# Patient Record
Sex: Female | Born: 1995 | Race: Black or African American | Hispanic: No | Marital: Single | Smoking: Current some day smoker
Health system: Southern US, Community
[De-identification: ages and names within clinical notes are randomized; demographics above are authoritative.]

## PROBLEM LIST (undated history)

## (undated) ENCOUNTER — Inpatient Hospital Stay (HOSPITAL_COMMUNITY): Payer: Self-pay

## (undated) DIAGNOSIS — N390 Urinary tract infection, site not specified: Secondary | ICD-10-CM

## (undated) DIAGNOSIS — B999 Unspecified infectious disease: Secondary | ICD-10-CM

## (undated) DIAGNOSIS — R011 Cardiac murmur, unspecified: Secondary | ICD-10-CM

## (undated) DIAGNOSIS — O139 Gestational [pregnancy-induced] hypertension without significant proteinuria, unspecified trimester: Secondary | ICD-10-CM

## (undated) DIAGNOSIS — Z889 Allergy status to unspecified drugs, medicaments and biological substances status: Secondary | ICD-10-CM

## (undated) DIAGNOSIS — D649 Anemia, unspecified: Secondary | ICD-10-CM

## (undated) DIAGNOSIS — S065X9A Traumatic subdural hemorrhage with loss of consciousness of unspecified duration, initial encounter: Secondary | ICD-10-CM

## (undated) DIAGNOSIS — S066X9A Traumatic subarachnoid hemorrhage with loss of consciousness of unspecified duration, initial encounter: Secondary | ICD-10-CM

## (undated) DIAGNOSIS — A749 Chlamydial infection, unspecified: Secondary | ICD-10-CM

## (undated) HISTORY — DX: Traumatic subdural hemorrhage with loss of consciousness of unspecified duration, initial encounter: S06.5X9A

## (undated) HISTORY — PX: NO PAST SURGERIES: SHX2092

## (undated) HISTORY — DX: Traumatic subarachnoid hemorrhage with loss of consciousness of unspecified duration, initial encounter: S06.6X9A

## (undated) HISTORY — PX: INDUCED ABORTION: SHX677

---
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
## 2014-04-10 ENCOUNTER — Encounter (HOSPITAL_COMMUNITY): Payer: Self-pay | Admitting: *Deleted

## 2014-04-10 ENCOUNTER — Inpatient Hospital Stay (HOSPITAL_COMMUNITY)
Admission: AD | Admit: 2014-04-10 | Discharge: 2014-04-10 | Disposition: A | Discharge status: Home / Self Care | Payer: Medicaid Other | Source: Ambulatory Visit | Attending: Obstetrics & Gynecology | Admitting: Obstetrics & Gynecology

## 2014-04-10 DIAGNOSIS — A749 Chlamydial infection, unspecified: Secondary | ICD-10-CM | POA: Diagnosis not present

## 2014-04-10 DIAGNOSIS — N760 Acute vaginitis: Secondary | ICD-10-CM

## 2014-04-10 DIAGNOSIS — N898 Other specified noninflammatory disorders of vagina: Secondary | ICD-10-CM | POA: Diagnosis present

## 2014-04-10 DIAGNOSIS — F17210 Nicotine dependence, cigarettes, uncomplicated: Secondary | ICD-10-CM | POA: Insufficient documentation

## 2014-04-10 LAB — URINALYSIS, ROUTINE W REFLEX MICROSCOPIC
Bilirubin Urine: NEGATIVE
Glucose, UA: NEGATIVE mg/dL
Ketones, ur: NEGATIVE mg/dL
Leukocytes, UA: NEGATIVE
NITRITE: NEGATIVE
Protein, ur: NEGATIVE mg/dL
SPECIFIC GRAVITY, URINE: 1.025 (ref 1.005–1.030)
Urobilinogen, UA: 0.2 mg/dL (ref 0.0–1.0)
pH: 6.5 (ref 5.0–8.0)

## 2014-04-10 LAB — WET PREP, GENITAL
TRICH WET PREP: NONE SEEN
WBC, Wet Prep HPF POC: NONE SEEN
Yeast Wet Prep HPF POC: NONE SEEN

## 2014-04-10 LAB — URINE MICROSCOPIC-ADD ON

## 2014-04-10 LAB — POCT PREGNANCY, URINE: PREG TEST UR: NEGATIVE

## 2014-04-10 MED ORDER — FLUCONAZOLE 150 MG PO TABS: 150.0000 mg | ORAL_TABLET | Freq: Every day | ORAL | Status: DC

## 2014-04-10 MED ORDER — AZITHROMYCIN 250 MG PO TABS
1000.0000 mg | ORAL_TABLET | Freq: Once | ORAL | Status: AC
  Administered 2014-04-10: 1000 mg via ORAL
  Filled 2014-04-10: qty 4

## 2014-04-10 NOTE — MAU Note (Signed)
Upset; pt's boyfriend diagnosed with STD; desires to have STD check; does not want HIV test; c/o vaginal discharge but denies any pain; nervous about having speculum exam; also c/o rash on upper abdomen that intermittently feels dry and itchy;

## 2014-04-10 NOTE — MAU Provider Note (Signed)
History     CSN: [id number]  Arrival date and time: 04/10/14 [id number]   First Provider Initiated Contact with Patient 04/10/14 1037      Chief Complaint  Patient presents with  . Vaginal Discharge   HPI    Ms. Evelyn Mcclain is 19 y.o. female G0P0 who presents for STD testing. Her partner was diagnosed recently with an STD however he is not telling her what the STD is.   Her partner told her that he was prescribed azithromycin after going to the STD clinic; he would not tell her what the medication was prescribed for.     OB History    Gravida Para Term Preterm AB TAB SAB Ectopic Multiple Living   0               Past Medical History  Diagnosis Date  . Heart murmur   . Asthma   . Hx of seasonal allergies     Past Surgical History  Procedure Laterality Date  . No past surgeries      Family History  Problem Relation Age of Onset  . Hypertension Mother   . Hypertension Maternal Aunt   . Hypertension Maternal Uncle   . Hypertension Maternal Grandmother   . Hypertension Maternal Grandfather   . Diabetes Paternal Grandmother   . Diabetes Paternal Grandfather     History  Substance Use Topics  . Smoking status: Current Every Day Smoker -- 0.25 packs/day  . Smokeless tobacco: Never Used  . Alcohol Use: No    Allergies:  Allergies  Allergen Reactions  . Septra [Bactrim] Rash    No prescriptions prior to admission   Results for orders placed or performed during the hospital encounter of 04/10/14 (from the past 48 hour(s))  Urinalysis, Routine w reflex microscopic     Status: Abnormal   Collection Time: 04/10/14  9:50 AM  Result Value Ref Range   Color, Urine YELLOW YELLOW   APPearance CLEAR CLEAR   Specific Gravity, Urine 1.025 1.005 - 1.030   pH 6.5 5.0 - 8.0   Glucose, UA NEGATIVE NEGATIVE mg/dL   Hgb urine dipstick TRACE (A) NEGATIVE   Bilirubin Urine NEGATIVE NEGATIVE   Ketones, ur NEGATIVE NEGATIVE mg/dL   Protein, ur NEGATIVE NEGATIVE mg/dL    Urobilinogen, UA 0.2 0.0 - 1.0 mg/dL   Nitrite NEGATIVE NEGATIVE   Leukocytes, UA NEGATIVE NEGATIVE  Urine microscopic-add on     Status: Abnormal   Collection Time: 04/10/14  9:50 AM  Result Value Ref Range   Squamous Epithelial / LPF FEW (A) RARE   RBC / HPF 0-2 <3 RBC/hpf   Bacteria, UA RARE RARE   Urine-Other MUCOUS PRESENT   Pregnancy, urine POC     Status: None   Collection Time: 04/10/14 10:07 AM  Result Value Ref Range   Preg Test, Ur NEGATIVE NEGATIVE    Comment:        THE SENSITIVITY OF THIS METHODOLOGY IS >24 mIU/mL   Wet prep, genital     Status: Abnormal   Collection Time: 04/10/14 11:00 AM  Result Value Ref Range   Yeast Wet Prep HPF POC NONE SEEN NONE SEEN   Trich, Wet Prep NONE SEEN NONE SEEN   Clue Cells Wet Prep HPF POC FEW (A) NONE SEEN   WBC, Wet Prep HPF POC NONE SEEN NONE SEEN    Comment: MANY BACTERIA SEEN    Review of Systems  Constitutional: Negative for fever and chills.  Gastrointestinal: Negative  for nausea, vomiting and abdominal pain.  Genitourinary: Negative for dysuria, urgency and frequency.       Odorous, white discharge; with itching    Physical Exam   Blood pressure 129/76, pulse 56, temperature 98.6 F (37 C), resp. rate 18, height  (1.702 m), weight 63.504 kg (140 lb), last menstrual period 04/03/2014.  Physical Exam  Constitutional: She is oriented to person, place, and time. She appears well-developed and well-nourished. No distress.  HENT:  Head: Normocephalic.  Eyes: Pupils are equal, round, and reactive to light.  Neck: Neck supple.  Respiratory: Effort normal.  GI: Soft.  Genitourinary:  GC and wet prep collected without a speculum   Musculoskeletal: Normal range of motion.  Neurological: She is oriented to person, place, and time.  Skin: Skin is warm. She is not diaphoretic.  Psychiatric: Her behavior is normal.    MAU Course  Procedures  None  MDM UA UPT  Wet prep GC- pending HIV- pending    Azithromycin 1 gram PO given in MAU  Assessment and Plan   A:  Abnormal vaginal discharge Chlamydia   P:  Discharge in stable condition RX: Diflucan  Condoms always Follow up with HD as needed No intercourse for 7 days once partner is treated.   Duane Lope, NP 04/10/2014 10:43 AM

## 2014-04-11 LAB — GC/CHLAMYDIA PROBE AMP (~~LOC~~) NOT AT ARMC
CHLAMYDIA, DNA PROBE: NEGATIVE
Neisseria Gonorrhea: NEGATIVE

## 2014-04-11 LAB — RPR: RPR: NONREACTIVE

## 2014-04-11 LAB — HIV ANTIBODY (ROUTINE TESTING W REFLEX): HIV SCREEN 4TH GENERATION: NONREACTIVE

## 2014-06-19 ENCOUNTER — Encounter (HOSPITAL_COMMUNITY): Payer: Self-pay | Admitting: Emergency Medicine

## 2014-06-19 ENCOUNTER — Emergency Department (HOSPITAL_COMMUNITY): Payer: Medicaid Other

## 2014-06-19 ENCOUNTER — Emergency Department (HOSPITAL_COMMUNITY)
Admission: EM | Admit: 2014-06-19 | Discharge: 2014-06-19 | Disposition: A | Discharge status: Home / Self Care | Payer: Medicaid Other | Attending: Emergency Medicine | Admitting: Emergency Medicine

## 2014-06-19 DIAGNOSIS — Z79899 Other long term (current) drug therapy: Secondary | ICD-10-CM | POA: Insufficient documentation

## 2014-06-19 DIAGNOSIS — J45909 Unspecified asthma, uncomplicated: Secondary | ICD-10-CM | POA: Diagnosis not present

## 2014-06-19 DIAGNOSIS — Y92410 Unspecified street and highway as the place of occurrence of the external cause: Secondary | ICD-10-CM | POA: Diagnosis not present

## 2014-06-19 DIAGNOSIS — M25511 Pain in right shoulder: Secondary | ICD-10-CM

## 2014-06-19 DIAGNOSIS — M549 Dorsalgia, unspecified: Secondary | ICD-10-CM

## 2014-06-19 DIAGNOSIS — R011 Cardiac murmur, unspecified: Secondary | ICD-10-CM | POA: Diagnosis not present

## 2014-06-19 DIAGNOSIS — Y998 Other external cause status: Secondary | ICD-10-CM | POA: Diagnosis not present

## 2014-06-19 DIAGNOSIS — Y9389 Activity, other specified: Secondary | ICD-10-CM | POA: Diagnosis not present

## 2014-06-19 DIAGNOSIS — S299XXA Unspecified injury of thorax, initial encounter: Secondary | ICD-10-CM | POA: Diagnosis not present

## 2014-06-19 DIAGNOSIS — S4991XA Unspecified injury of right shoulder and upper arm, initial encounter: Secondary | ICD-10-CM | POA: Diagnosis present

## 2014-06-19 DIAGNOSIS — Z720 Tobacco use: Secondary | ICD-10-CM | POA: Insufficient documentation

## 2014-06-19 MED ORDER — NAPROXEN 500 MG PO TABS: 500.0000 mg | ORAL_TABLET | Freq: Two times a day (BID) | ORAL | Status: DC

## 2014-06-19 MED ORDER — METHOCARBAMOL 500 MG PO TABS: 500.0000 mg | ORAL_TABLET | Freq: Two times a day (BID) | ORAL | Status: DC
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
## 2014-09-26 ENCOUNTER — Inpatient Hospital Stay (HOSPITAL_COMMUNITY)
Admission: AD | Admit: 2014-09-26 | Discharge: 2014-09-26 | Disposition: A | Discharge status: Home / Self Care | Payer: Self-pay | Source: Ambulatory Visit | Attending: Obstetrics & Gynecology | Admitting: Obstetrics & Gynecology

## 2014-09-26 ENCOUNTER — Encounter (HOSPITAL_COMMUNITY): Payer: Self-pay | Admitting: *Deleted

## 2014-09-26 DIAGNOSIS — N898 Other specified noninflammatory disorders of vagina: Secondary | ICD-10-CM | POA: Insufficient documentation

## 2014-09-26 DIAGNOSIS — F17210 Nicotine dependence, cigarettes, uncomplicated: Secondary | ICD-10-CM | POA: Insufficient documentation

## 2014-09-26 DIAGNOSIS — A749 Chlamydial infection, unspecified: Secondary | ICD-10-CM | POA: Insufficient documentation

## 2014-09-26 DIAGNOSIS — Z202 Contact with and (suspected) exposure to infections with a predominantly sexual mode of transmission: Secondary | ICD-10-CM

## 2014-09-26 HISTORY — DX: Chlamydial infection, unspecified: A74.9

## 2014-09-26 LAB — POCT PREGNANCY, URINE: PREG TEST UR: NEGATIVE

## 2014-09-26 LAB — URINALYSIS, ROUTINE W REFLEX MICROSCOPIC
Bilirubin Urine: NEGATIVE
GLUCOSE, UA: NEGATIVE mg/dL
KETONES UR: NEGATIVE mg/dL
LEUKOCYTES UA: NEGATIVE
Nitrite: NEGATIVE
PH: 8 (ref 5.0–8.0)
Protein, ur: NEGATIVE mg/dL
Specific Gravity, Urine: 1.02 (ref 1.005–1.030)
Urobilinogen, UA: 1 mg/dL (ref 0.0–1.0)

## 2014-09-26 LAB — URINE MICROSCOPIC-ADD ON

## 2014-09-26 LAB — WET PREP, GENITAL
Trich, Wet Prep: NONE SEEN
Yeast Wet Prep HPF POC: NONE SEEN

## 2014-09-26 MED ORDER — CEFTRIAXONE SODIUM 250 MG IJ SOLR
250.0000 mg | Freq: Once | INTRAMUSCULAR | Status: AC
  Administered 2014-09-26: 250 mg via INTRAMUSCULAR
  Filled 2014-09-26: qty 250

## 2014-09-26 MED ORDER — FLUCONAZOLE 150 MG PO TABS
150.0000 mg | ORAL_TABLET | Freq: Once | ORAL | Status: AC
  Administered 2014-09-26: 150 mg via ORAL
  Filled 2014-09-26: qty 1

## 2014-09-26 MED ORDER — AZITHROMYCIN 250 MG PO TABS
1000.0000 mg | ORAL_TABLET | Freq: Once | ORAL | Status: AC
  Administered 2014-09-26: 1000 mg via ORAL
  Filled 2014-09-26: qty 4

## 2014-09-26 MED ORDER — FLUCONAZOLE 150 MG PO TABS: 150.0000 mg | ORAL_TABLET | ORAL | Status: DC | PRN

## 2014-09-26 NOTE — MAU Note (Signed)
Pt reports she had chlamydia in May. Still having discharge and itching. Thinks she did not wait ling enugh before she had intercourse after she was treated.

## 2014-09-26 NOTE — Discharge Instructions (Signed)

## 2014-09-26 NOTE — MAU Provider Note (Signed)
Chief Complaint: Vaginal Discharge  First Provider Initiated Contact with Patient 09/26/14 1247.      SUBJECTIVE HPI: Evelyn Mcclain is a 19 y.o. G0P0 female who presents to Maternity Admissions reporting vaginal discharge, concern for chlamydia because she was diagnosed with Chlamydia in May, treated with antibiotics, but resumed intercourse less than a week afterward. She is also requesting a pregnancy test. She states she had a positive pregnancy test at a different hospital 2 months ago and had one day of vaginal bleeding 09/09/2014. Did not have any ultrasounds or lab work done in addition to the urine pregnancy test.   No associated fever, chills, abdominal pain or passage of tissue or clots. Patient states she has been having unprotected intercourse. She is not taken anything for the vaginal discharge.  Past Medical History  Diagnosis Date  . Heart murmur   . Hx of seasonal allergies   . Chlamydia    OB History  Gravida Para Term Preterm AB SAB TAB Ectopic Multiple Living  0                Past Surgical History  Procedure Laterality Date  . No past surgeries     Social History   Social History  . Marital Status: Single    Spouse Name: N/A  . Number of Children: N/A  . Years of Education: N/A   Occupational History  . Not on file.   Social History Main Topics  . Smoking status: Current Every Day Smoker -- 0.25 packs/day    Types: Cigarettes  . Smokeless tobacco: Never Used  . Alcohol Use: No  . Drug Use: No     Comment: 1 month ago  . Sexual Activity: Yes    Birth Control/ Protection: None   Other Topics Concern  . Not on file   Social History Narrative   No current facility-administered medications on file prior to encounter.   Current Outpatient Prescriptions on File Prior to Encounter  Medication Sig Dispense Refill  . fluconazole (DIFLUCAN) 150 MG tablet Take 1 tablet (150 mg total) by mouth daily. 2 tablet 0  . methocarbamol (ROBAXIN) 500 MG tablet  Take 1 tablet (500 mg total) by mouth 2 (two) times daily. 15 tablet 0  . naproxen (NAPROSYN) 500 MG tablet Take 1 tablet (500 mg total) by mouth 2 (two) times daily. 30 tablet 0   Allergies  Allergen Reactions  . Septra [Bactrim] Rash    I have reviewed the past Medical Hx, Surgical Hx, Social Hx, Allergies and Medications.   Review of Systems  Constitutional: Negative for fever and chills.  Gastrointestinal: Negative for abdominal pain.  Genitourinary: Positive for vaginal discharge and menstrual problem (LMP was shorter than usual). Negative for dysuria, frequency, vaginal bleeding, vaginal pain and pelvic pain.  Musculoskeletal: Negative for back pain.    OBJECTIVE Patient Vitals for the past 24 hrs:  BP Temp Pulse Resp Height Weight  09/26/14 1213 121/76 mmHg 98.5 F (36.9 C) (!) 56 18  (1.702 m) 144 lb 6.4 oz (65.499 kg)   Constitutional: Well-developed, well-nourished female in no acute distress.  Cardiovascular: Mild bradycardia. Normal rhythm. Respiratory: normal rate and effort.  GI: Abd soft, non-tender. MS: Extremities nontender, no edema, normal ROM Neurologic: Alert and oriented x 4.  GU: Neg CVAT.  SPECULUM EXAM: NEFG except for folliculitis on mons pubis, moderate amount of thick, curd-like, white, odorless discharge consistent with yeast infection. No mucopurulent discharge. no blood noted, cervix clean, but incompletely  visualized due to patient intolerance of exam.  BIMANUAL: Refused due to intolerance of exam.  LAB RESULTS Pregnancy, urine POC     Status: None   Collection Time: 09/26/14 12:50 PM  Result Value Ref Range   Preg Test, Ur NEGATIVE NEGATIVE    IMAGING No results found.  MAU COURSE UPT, GC/chlamydia cultures, wet prep. UPT initially reported positive due to collection error. Recollected, negative.  Patient requesting retreatment and expedited partner therapy. Given Zithromax and and Rocephin due to unprotected intercourse. Diflucan  given for clinical diagnosis of vaginal yeast infection.  MDM 19 year old female with STD re-exposure before adequate treatment for chlamydia now retreated in maternity admissions and given expedited partner therapy. No evidence of PID or any other emergent condition today. Partner present and denies medication allergies. Patient with reported positive UPT 4 months ago, one episode of vaginal bleeding since then and negative UPT today. No documentation of UPT in May. Possible early miscarriage.   ASSESSMENT 1. Vaginal discharge   2. Chlamydia contact, untreated    PLAN Discharge home in stable condition. No intercourse for at least one week after patient and partner treated. Encourage condom use. Will follow up with health department for birth control. Risks of STDs reviewed in detail with patient and partner.  Follow-up Information    Follow up with Gynecologist.   Why:  Routine gynecology care       Medication List    TAKE these medications        fluconazole 150 MG tablet  Commonly known as:  DIFLUCAN  Take 1 tablet (150 mg total) by mouth every three (3) days as needed.     methocarbamol 500 MG tablet  Commonly known as:  ROBAXIN  Take 1 tablet (500 mg total) by mouth 2 (two) times daily.     naproxen 500 MG tablet  Commonly known as:  NAPROSYN  Take 1 tablet (500 mg total) by mouth 2 (two) times daily.       [place], CNM 09/26/2014  12:56 PM

## 2014-09-27 LAB — GC/CHLAMYDIA PROBE AMP (~~LOC~~) NOT AT ARMC
CHLAMYDIA, DNA PROBE: NEGATIVE
Neisseria Gonorrhea: NEGATIVE

## 2015-03-31 ENCOUNTER — Encounter (HOSPITAL_COMMUNITY): Payer: Self-pay | Admitting: *Deleted

## 2015-03-31 ENCOUNTER — Inpatient Hospital Stay (HOSPITAL_COMMUNITY)
Admission: AD | Admit: 2015-03-31 | Discharge: 2015-03-31 | Disposition: A | Discharge status: Home / Self Care | Payer: No Typology Code available for payment source | Source: Ambulatory Visit | Attending: Family Medicine | Admitting: Family Medicine

## 2015-03-31 DIAGNOSIS — A499 Bacterial infection, unspecified: Secondary | ICD-10-CM

## 2015-03-31 DIAGNOSIS — Z113 Encounter for screening for infections with a predominantly sexual mode of transmission: Secondary | ICD-10-CM

## 2015-03-31 DIAGNOSIS — N760 Acute vaginitis: Secondary | ICD-10-CM | POA: Insufficient documentation

## 2015-03-31 DIAGNOSIS — F17210 Nicotine dependence, cigarettes, uncomplicated: Secondary | ICD-10-CM | POA: Insufficient documentation

## 2015-03-31 DIAGNOSIS — N390 Urinary tract infection, site not specified: Secondary | ICD-10-CM | POA: Insufficient documentation

## 2015-03-31 DIAGNOSIS — B9689 Other specified bacterial agents as the cause of diseases classified elsewhere: Secondary | ICD-10-CM

## 2015-03-31 DIAGNOSIS — Z202 Contact with and (suspected) exposure to infections with a predominantly sexual mode of transmission: Secondary | ICD-10-CM

## 2015-03-31 LAB — CBC
HCT: 32.7 % — ABNORMAL LOW (ref 36.0–46.0)
Hemoglobin: 10.6 g/dL — ABNORMAL LOW (ref 12.0–15.0)
MCH: 30.1 pg (ref 26.0–34.0)
MCHC: 32.4 g/dL (ref 30.0–36.0)
MCV: 92.9 fL (ref 78.0–100.0)
PLATELETS: 271 10*3/uL (ref 150–400)
RBC: 3.52 MIL/uL — ABNORMAL LOW (ref 3.87–5.11)
RDW: 12.2 % (ref 11.5–15.5)
WBC: 7.9 10*3/uL (ref 4.0–10.5)

## 2015-03-31 LAB — URINALYSIS, ROUTINE W REFLEX MICROSCOPIC
BILIRUBIN URINE: NEGATIVE
GLUCOSE, UA: NEGATIVE mg/dL
KETONES UR: NEGATIVE mg/dL
Leukocytes, UA: NEGATIVE
Nitrite: NEGATIVE
PH: 7.5 (ref 5.0–8.0)
Protein, ur: NEGATIVE mg/dL
Specific Gravity, Urine: 1.02 (ref 1.005–1.030)

## 2015-03-31 LAB — WET PREP, GENITAL
Sperm: NONE SEEN
Trich, Wet Prep: NONE SEEN
Yeast Wet Prep HPF POC: NONE SEEN

## 2015-03-31 LAB — URINE MICROSCOPIC-ADD ON

## 2015-03-31 LAB — POCT PREGNANCY, URINE: Preg Test, Ur: NEGATIVE

## 2015-03-31 MED ORDER — ONDANSETRON 8 MG PO TBDP
8.0000 mg | ORAL_TABLET | Freq: Once | ORAL | Status: AC
  Administered 2015-03-31: 8 mg via ORAL
  Filled 2015-03-31: qty 1

## 2015-03-31 MED ORDER — AZITHROMYCIN 250 MG PO TABS
1000.0000 mg | ORAL_TABLET | Freq: Once | ORAL | Status: AC
  Administered 2015-03-31: 1000 mg via ORAL
  Filled 2015-03-31: qty 4

## 2015-03-31 MED ORDER — METRONIDAZOLE 500 MG PO TABS: 500.0000 mg | ORAL_TABLET | Freq: Two times a day (BID) | ORAL | Status: DC

## 2015-03-31 NOTE — Discharge Instructions (Signed)
Chlamydia, Female Chlamydia is an infection. It is spread through sexual contact. Chlamydia can be in different areas of the body. These areas include the cervix, urethra, throat, or rectum. You may not know you have chlamydia because many people never develop the symptoms. Chlamydia is not difficult to treat once you know you have it. However, if it is left untreated, chlamydia can lead to more serious health problems.  CAUSES  Chlamydia is caused by bacteria. It is a sexually transmitted disease. It is passed from an infected partner during intimate contact. This contact could be with the genitals, mouth, or rectal area. Chlamydia can also be passed from mothers to babies during birth. SIGNS AND SYMPTOMS  There may not be any symptoms. This is often the case early in the infection. If symptoms develop, they may include:  Mild pain and discomfort when urinating.  Redness, soreness, and swelling (inflammation) of the rectum.  Vaginal discharge.  Painful intercourse.  Abdominal pain.  Bleeding between menstrual periods. DIAGNOSIS  To diagnose this infection, your health care provider will do a pelvic exam. Cultures will be taken of the vagina, cervix, urine, and possibly the rectum to verify the diagnosis.  TREATMENT You will be given antibiotic medicines. If you are pregnant, certain types of antibiotics will need to be avoided. Any sexual partners should also be treated, even if they do not show symptoms. Your health care provider may test you for infection again 3 months after treatment. HOME CARE INSTRUCTIONS   Take your antibiotic medicine as directed by your health care provider. Finish the antibiotic even if you start to feel better.  Take medicines only as directed by your health care provider.  Inform any sexual partners about the infection. They should also be treated.  Do not have sexual contact until your health care provider tells you it is okay.  Get plenty of  rest.  Eat a well-balanced diet.  Drink enough fluids to keep your urine clear or pale yellow.  Keep all follow-up visits as directed by your health care provider. SEEK MEDICAL CARE IF:  You have painful urination.  You have abdominal pain.  You have vaginal discharge.  You have painful sexual intercourse.  You have bleeding between periods and after sex.  You have a fever. SEEK IMMEDIATE MEDICAL CARE IF:   You experience nausea or vomiting.  You experience excessive sweating (diaphoresis).  You have difficulty swallowing.   This information is not intended to replace advice given to you by your health care provider. Make sure you discuss any questions you have with your health care provider.   Document Released: 10/21/2004 Document Revised: 10/02/2014 Document Reviewed: 09/18/2012 Elsevier Interactive Patient Education 2016 Elsevier Inc[employer]  Bacterial Vaginosis Bacterial vaginosis is a vaginal infection that occurs when the normal balance of bacteria in the vagina is disrupted. It results from an overgrowth of certain bacteria. This is the most common vaginal infection in women of childbearing age. Treatment is important to prevent complications, especially in pregnant women, as it can cause a premature delivery. CAUSES  Bacterial vaginosis is caused by an increase in harmful bacteria that are normally present in smaller amounts in the vagina. Several different kinds of bacteria can cause bacterial vaginosis. However, the reason that the condition develops is not fully understood. RISK FACTORS Certain activities or behaviors can put you at an increased risk of developing bacterial vaginosis, including:  Having a new sex partner or multiple sex partners.  Douching.  Using an intrauterine device (  IUD) for contraception. Women do not get bacterial vaginosis from toilet seats, bedding, swimming pools, or contact with objects around them. SIGNS AND SYMPTOMS  Some women with  bacterial vaginosis have no signs or symptoms. Common symptoms include:  Grey vaginal discharge.  A fishlike odor with discharge, especially after sexual intercourse.  Itching or burning of the vagina and vulva.  Burning or pain with urination. DIAGNOSIS  Your health care provider will take a medical history and examine the vagina for signs of bacterial vaginosis. A sample of vaginal fluid may be taken. Your health care provider will look at this sample under a microscope to check for bacteria and abnormal cells. A vaginal pH test may also be done.  TREATMENT  Bacterial vaginosis may be treated with antibiotic medicines. These may be given in the form of a pill or a vaginal cream. A second round of antibiotics may be prescribed if the condition comes back after treatment. Because bacterial vaginosis increases your risk for sexually transmitted diseases, getting treated can help reduce your risk for chlamydia, gonorrhea, HIV, and herpes. HOME CARE INSTRUCTIONS   Only take over-the-counter or prescription medicines as directed by your health care provider.  If antibiotic medicine was prescribed, take it as directed. Make sure you finish it even if you start to feel better.  Tell all sexual partners that you have a vaginal infection. They should see their health care provider and be treated if they have problems, such as a mild rash or itching.  During treatment, it is important that you follow these instructions:  Avoid sexual activity or use condoms correctly.  Do not douche.  Avoid alcohol as directed by your health care provider.  Avoid breastfeeding as directed by your health care provider. SEEK MEDICAL CARE IF:   Your symptoms are not improving after 3 days of treatment.  You have increased discharge or pain.  You have a fever. MAKE SURE YOU:   Understand these instructions.  Will watch your condition.  Will get help right away if you are not doing well or get  worse. FOR MORE INFORMATION  Centers for Disease Control and Prevention, Division of STD Prevention: [employer]Apps[employer] American Sexual Health Association (ASHA): www.ashastd.org    This information is not intended to replace advice given to you by your health care provider. Make sure you discuss any questions you have with your health care provider.   Document Released: 01/11/2005 Document Revised: 02/01/2014 Document Reviewed: 08/23/2012 Elsevier Interactive Patient Education 2016 [employer]. Safe Sex Safe sex is about reducing the risk of giving or getting a sexually transmitted disease (STD). STDs are spread through sexual contact involving the genitals, mouth, or rectum. Some STDs can be cured and others cannot. Safe sex can also prevent unintended pregnancies.  WHAT ARE SOME SAFE SEX PRACTICES?  Limit your sexual activity to only one partner who is having sex with only you.  Talk to your partner about his or her past partners, past STDs, and drug use.  Use a condom every time you have sexual intercourse. This includes vaginal, oral, and anal sexual activity. Both females and males should wear condoms during oral sex. Only use latex or polyurethane condoms and water-based lubricants. Using petroleum-based lubricants or oils to lubricate a condom will weaken the condom and increase the chance that it will break. The condom should be in place from the beginning to the end of sexual activity. Wearing a condom reduces, but does not completely eliminate, your risk of getting or  giving an STD. STDs can be spread by contact with infected body fluids and skin.  Get vaccinated for hepatitis B and HPV.  Avoid alcohol and recreational drugs, which can affect your judgment. You may forget to use a condom or participate in high-risk sex.  For females, avoid douching after sexual intercourse. Douching can spread an infection farther into the reproductive tract.  Check your body for signs of sores,  blisters, rashes, or unusual discharge. See your health care provider if you notice any of these signs.  Avoid sexual contact if you have symptoms of an infection or are being treated for an STD. If you or your partner has herpes, avoid sexual contact when blisters are present. Use condoms at all other times.  If you are at risk of being infected with HIV, it is recommended that you take a prescription medicine daily to prevent HIV infection. This is called pre-exposure prophylaxis (PrEP). You are considered at risk if:  You are a man who has sex with other men (MSM).  You are a heterosexual man or woman who is sexually active with more than one partner.  You take drugs by injection.  You are sexually active with a partner who has HIV.  Talk with your health care provider about whether you are at high risk of being infected with HIV. If you choose to begin PrEP, you should first be tested for HIV. You should then be tested every 3 months for as long as you are taking PrEP.  See your health care provider for regular screenings, exams, and tests for other STDs. Before having sex with a new partner, each of you should be screened for STDs and should talk about the results with each other. WHAT ARE THE BENEFITS OF SAFE SEX?   There is less chance of getting or giving an STD.  You can prevent unwanted or unintended pregnancies.  By discussing safe sex concerns with your partner, you may increase feelings of intimacy, comfort, trust, and honesty between the two of you.   This information is not intended to replace advice given to you by your health care provider. Make sure you discuss any questions you have with your health care provider.   Document Released: 02/19/2004 Document Revised: 02/01/2014 Document Reviewed: 07/05/2011 Elsevier Interactive Patient Education 2016 [employer].            Expedited Partner Therapy:  Information Sheet for Patients and Partners                You have been offered expedited partner therapy (EPT). This information sheet contains important information and warnings you need to be aware of, so please read it carefully.   Expedited Partner Therapy (EPT) is the clinical practice of treating the sexual partners of persons who receive chlamydia, gonorrhea, or trichomoniasis diagnoses by providing medications or prescriptions to the patient. Patients then provide partners with these therapies without the health-care provider having examined the partner. In other words, EPT is a convenient, fast and private way for patients to help their sexual partners get treated.   Chlamydia and gonorrhea are bacterial infections you get from having sex with a person who is already infected. Trichomoniasis (or trich) is a very common sexually transmitted infection (STI) that is caused by infection with a protozoan parasite called Trichomonas vaginalis.  Many people with these infections dont know it because they feel fine, but without treatment these infections can cause serious health problems, such as pelvic inflammatory disease, ectopic  pregnancy, infertility and increased risk of HIV.   It is important to get treated as soon as possible to protect your health, to avoid spreading these infections to others, and to prevent yourself from becoming re-infected. The good news is these infections can be easily cured with proper antibiotic medicine. The best way to take care of your self is to see a doctor or go to your local health department. If you are not able to see a doctor or other medical provider, you should take EPT.    Recommended Medication: EPT for Chlamydia:  Azithromycin (Zithromax) 1 gram orally in a single dose EPT for Gonorrhea:  Cefixime (Suprax) 400 milligrams orally in a single dose PLUS azithromycin (Zithromax) 1 gram orally in a single dose EPT for Trichomoniasis:  Metronidazole (Flagyl) 2 grams orally in a single  dose   These medicines are very safe. However, you should not take them if you have ever had an allergic reaction (like a rash) to any of these medicines: azithromycin (Zithromax), erythromycin, clarithromycin (Biaxin), metronidazole (Flagyl), tinidazole (Tindimax). If you are uncertain about whether you have an allergy, call your medical provider or pharmacist before taking this medicine. If you have a serious, long-term illness like kidney, liver or heart disease, colitis or stomach problems, or you are currently taking other prescription medication, talk to your provider before taking this medication.   Women: If you have lower belly pain, pain during sex, vomiting, or a fever, do not take this medicine. Instead, you should see a medical provider to be certain you do not have pelvic inflammatory disease (PID). PID can be serious and lead to infertility, pregnancy problems or chronic pelvic pain.   Pregnant Women: It is very important for you to see a doctor to get pregnancy services and pre-natal care. These antibiotics for EPT are safe for pregnant women, but you still need to see a medical provider as soon as possible. It is also important to note that Doxycycline is an alternative therapy for chlamydia, but it should not be taken by someone who is pregnant.   Men: If you have pain or swelling in the testicles or a fever, do not take this medicine and see a medical provider.     Men who have sex with men (MSM): MSM in [place] continue to experience high rates of syphilis and HIV. Many MSM with gonorrhea or chlamydia could also have syphilis and/or HIV and not know it. If you are a man who has sex with other men, it is very important that you see a medical provider and are tested for HIV and syphilis. EPT is not recommended for gonorrhea for MSM.  Recommended treatment for gonorrhea for MSM is Rocephin (shot) AND azithromycin due to decreased cure rate.  Please see your medical provider if this  is the case.    Along with this information sheet is a prescription for the medicine. If you receive a prescription it will be in your name and will indicate your date of birth, or it will be in the name of Expedited Partner Therapy.   In either case, you can have the prescription filled at a pharmacy. You will be responsible for the cost of the medicine, unless you have prescription drug coverage. In that case, you could provide your name so the pharmacy could bill your health plan.   Take the medication as directed. Some people will have a mild, upset stomach, which does not last long. AVOID alcohol 24 hours after  taking metronidazole (Flagyl) to reduce the possibility of a disulfiram-like reaction (severe vomiting and abdominal pain).  After taking the medicine, do not have sex for 7 days. Do not share this medicine or give it to anyone else. It is important to tell everyone you have had sex with in the last 60 days that they need to go and get tested for sexually transmitted infections.   Ways to prevent these and other sexually transmitted infections (STIs):    Abstain from sex. This is the only sure way to avoid getting an STI.   Use barrier methods, such as condoms, consistently and correctly.   Limit the number of sexual partners.   Have regular physical exams, including testing for STIs.   For more information about EPT or other issues pertaining to an STI, please contact your medical provider or the [facility] Department at [phone] or [email or web address]

## 2015-03-31 NOTE — MAU Provider Note (Signed)
History     CSN: [id number]  Arrival date and time: 03/31/15 1252   First Provider Initiated Contact with Patient 03/31/15 1322         Chief Complaint  Patient presents with  . Urinary Tract Infection  . Vaginitis   HPI   Evelyn Mcclain is a 20 y.o. female who presents for vaginal discharge & STD screening.  States was treated for chlamydia 2 months ago but didn't wait the 7 days to have intercourse with her partner. Since then has a new partner and used condoms "most of the time". Has new vaginal discharge since last week. Reports thick white discharge with foul odor. Reports vaginal irritation, nausea, & increased urinary frequency.  Denies vaginal bleeding, vomiting, fever, abdominal pain, dyspareunia, or post coital bleeding.     OB History    Gravida Para Term Preterm AB TAB SAB Ectopic Multiple Living   1    1  1          Past Medical History  Diagnosis Date  . Heart murmur   . Hx of seasonal allergies   . Chlamydia     Past Surgical History  Procedure Laterality Date  . No past surgeries      Family History  Problem Relation Age of Onset  . Hypertension Mother   . Hypertension Maternal Aunt   . Hypertension Maternal Uncle   . Hypertension Maternal Grandmother   . Hypertension Maternal Grandfather   . Diabetes Paternal Grandmother   . Diabetes Paternal Grandfather     Social History  Substance Use Topics  . Smoking status: Current Every Day Smoker -- 0.25 packs/day    Types: Cigarettes  . Smokeless tobacco: Never Used  . Alcohol Use: No    Allergies:  Allergies  Allergen Reactions  . Septra [Bactrim] Rash    Prescriptions prior to admission  Medication Sig Dispense Refill Last Dose  . fluconazole (DIFLUCAN) 150 MG tablet Take 1 tablet (150 mg total) by mouth every three (3) days as needed. 2 tablet 0   . methocarbamol (ROBAXIN) 500 MG tablet Take 1 tablet (500 mg total) by mouth 2 (two) times daily. (Patient not taking: Reported on 09/26/2014) 15  tablet 0 Not Taking at Unknown time  . naproxen (NAPROSYN) 500 MG tablet Take 1 tablet (500 mg total) by mouth 2 (two) times daily. (Patient not taking: Reported on 09/26/2014) 30 tablet 0 Not Taking at Unknown time    Review of Systems  Constitutional: Negative.  Negative for fever and chills.  HENT: Negative.   Eyes: Negative.   Gastrointestinal: Positive for nausea. Negative for vomiting, abdominal pain, diarrhea and constipation.  Genitourinary: Positive for frequency. Negative for dysuria and urgency.       + vaginal discharge No postcoital bleeding or dyspareunia No vaginal bleeding  Skin: Negative.   Neurological: Negative for dizziness.   Physical Exam   Blood pressure 131/81, pulse 72, resp. rate 18, last menstrual period 03/22/2015.  Physical Exam  Nursing note and vitals reviewed. Constitutional: She is oriented to person, place, and time. She appears well-developed and well-nourished. No distress.  HENT:  Head: Normocephalic and atraumatic.  Eyes: Conjunctivae are normal. Right eye exhibits no discharge. Left eye exhibits no discharge. No scleral icterus.  Neck: Normal range of motion.  Cardiovascular: Normal rate.   Respiratory: Effort normal. No respiratory distress.  GI: Soft. She exhibits no distension. There is no tenderness.  Genitourinary: Cervix exhibits no motion tenderness. No bleeding in the vagina.  Vaginal discharge (moderate amount of yellow mucoid discharge) found.  Neurological: She is alert and oriented to person, place, and time.  Skin: Skin is warm and dry. She is not diaphoretic.  Psychiatric: She has a normal mood and affect. Her behavior is normal. Judgment and thought content normal.    MAU Course  Procedures Results for orders placed or performed during the hospital encounter of 03/31/15 (from the past 24 hour(s))  Urinalysis, Routine w reflex microscopic (not at [facility])     Status: Abnormal   Collection Time: 03/31/15  1:00 PM  Result Value Ref  Range   Color, Urine YELLOW YELLOW   APPearance HAZY (A) CLEAR   Specific Gravity, Urine 1.020 1.005 - 1.030   pH 7.5 5.0 - 8.0   Glucose, UA NEGATIVE NEGATIVE mg/dL   Hgb urine dipstick TRACE (A) NEGATIVE   Bilirubin Urine NEGATIVE NEGATIVE   Ketones, ur NEGATIVE NEGATIVE mg/dL   Protein, ur NEGATIVE NEGATIVE mg/dL   Nitrite NEGATIVE NEGATIVE   Leukocytes, UA NEGATIVE NEGATIVE  Urine microscopic-add on     Status: Abnormal   Collection Time: 03/31/15  1:00 PM  Result Value Ref Range   Squamous Epithelial / LPF 6-30 (A) NONE SEEN   WBC, UA 0-5 0 - 5 WBC/hpf   RBC / HPF 0-5 0 - 5 RBC/hpf   Bacteria, UA MANY (A) NONE SEEN   Urine-Other MUCOUS PRESENT   Pregnancy, urine POC     Status: None   Collection Time: 03/31/15  1:35 PM  Result Value Ref Range   Preg Test, Ur NEGATIVE NEGATIVE  CBC     Status: Abnormal   Collection Time: 03/31/15  1:58 PM  Result Value Ref Range   WBC 7.9 4.0 - 10.5 K/uL   RBC 3.52 (L) 3.87 - 5.11 MIL/uL   Hemoglobin 10.6 (L) 12.0 - 15.0 g/dL   HCT 16.1 (L) 09.6 - 04.5 %   MCV 92.9 78.0 - 100.0 fL   MCH 30.1 26.0 - 34.0 pg   MCHC 32.4 30.0 - 36.0 g/dL   RDW 40.9 81.1 - 91.4 %   Platelets 271 150 - 400 K/uL  Wet prep, genital     Status: Abnormal   Collection Time: 03/31/15  2:30 PM  Result Value Ref Range   Yeast Wet Prep HPF POC NONE SEEN NONE SEEN   Trich, Wet Prep NONE SEEN NONE SEEN   Clue Cells Wet Prep HPF POC FEW (A) NONE SEEN   WBC, Wet Prep HPF POC MODERATE (A) NONE SEEN   Sperm NONE SEEN     MDM UPT negative Azithromycin 1 gm PO for chlamydia re exposure STD testing pending  Assessment and Plan  A: 1. Exposure to chlamydia   2. Screen for STD (sexually transmitted disease)   3. BV (bacterial vaginosis)     P: Discharge home Rx flagyl (no alcohol) No intercourse x 7 days after both have been treated Expedited partner treatment rx & info sheet given GC/CT, HIV, RPR pending   Judeth Horn, NP  03/31/2015 4:07 PM

## 2015-03-31 NOTE — MAU Note (Signed)
Patient states she and her boyfriend were treated for Chlamydia a few weeks ago, "but I don't think it cleared it."  She presents now c/o foul smelling vaginal discharge "that looks like a yeast infection."

## 2015-04-01 LAB — HIV ANTIBODY (ROUTINE TESTING W REFLEX): HIV SCREEN 4TH GENERATION: NONREACTIVE

## 2015-04-01 LAB — RPR: RPR: NONREACTIVE

## 2015-04-01 LAB — GC/CHLAMYDIA PROBE AMP (~~LOC~~) NOT AT ARMC
CHLAMYDIA, DNA PROBE: NEGATIVE
NEISSERIA GONORRHEA: NEGATIVE

## 2015-08-20 ENCOUNTER — Encounter (HOSPITAL_COMMUNITY): Payer: Self-pay | Admitting: Family Medicine

## 2015-08-20 ENCOUNTER — Ambulatory Visit (HOSPITAL_COMMUNITY)
Admission: EM | Admit: 2015-08-20 | Discharge: 2015-08-20 | Disposition: A | Discharge status: Home / Self Care | Payer: Medicaid Other | Attending: Family Medicine | Admitting: Family Medicine

## 2015-08-20 DIAGNOSIS — Z79899 Other long term (current) drug therapy: Secondary | ICD-10-CM | POA: Insufficient documentation

## 2015-08-20 DIAGNOSIS — J020 Streptococcal pharyngitis: Secondary | ICD-10-CM | POA: Insufficient documentation

## 2015-08-20 DIAGNOSIS — F17200 Nicotine dependence, unspecified, uncomplicated: Secondary | ICD-10-CM | POA: Insufficient documentation

## 2015-08-20 LAB — POCT RAPID STREP A: STREPTOCOCCUS, GROUP A SCREEN (DIRECT): NEGATIVE

## 2015-08-20 MED ORDER — AMOXICILLIN 875 MG PO TABS: 875.0000 mg | ORAL_TABLET | Freq: Two times a day (BID) | ORAL | 0 refills | Status: DC

## 2015-08-20 MED ORDER — LIDOCAINE VISCOUS 2 % MT SOLN: 20.0000 mL | OROMUCOSAL | 0 refills | Status: DC | PRN

## 2015-08-20 NOTE — ED Provider Notes (Signed)
CSN: [id number]     Arrival date & time 08/20/15  1155 History   None    Chief Complaint  Patient presents with  . Sore Throat   (Consider location/radiation/quality/duration/timing/severity/associated sxs/prior Treatment)  Sore Throat  This is a new problem. The problem occurs constantly. The problem has not changed since onset.The symptoms are aggravated by swallowing. The symptoms are relieved by ice. She has tried nothing for the symptoms.    Past Medical History:  Diagnosis Date  . Chlamydia   . Heart murmur   . Hx of seasonal allergies    Past Surgical History:  Procedure Laterality Date  . NO PAST SURGERIES     Family History  Problem Relation Age of Onset  . Hypertension Mother   . Hypertension Maternal Aunt   . Hypertension Maternal Uncle   . Hypertension Maternal Grandmother   . Hypertension Maternal Grandfather   . Diabetes Paternal Grandmother   . Diabetes Paternal Grandfather    Social History  Substance Use Topics  . Smoking status: Current Every Day Smoker    Packs/day: 0.25    Types: Cigarettes  . Smokeless tobacco: Never Used  . Alcohol use No   OB History    Gravida Para Term Preterm AB Living   1       1     SAB TAB Ectopic Multiple Live Births   1             Review of Systems  Constitutional: Positive for fatigue and fever.  HENT: Positive for sore throat.   Eyes: Negative.   Respiratory: Negative.   Cardiovascular: Negative.   Gastrointestinal: Negative.   Endocrine: Negative.   Genitourinary: Negative.   Musculoskeletal: Negative.   Skin: Negative.   Allergic/Immunologic: Negative.   Neurological: Negative.   Hematological: Negative.   Psychiatric/Behavioral: Negative.     Allergies  Septra [bactrim]  Home Medications   Prior to Admission medications   Medication Sig Start Date End Date Taking? Authorizing Provider  amoxicillin (AMOXIL) 875 MG tablet Take 1 tablet (875 mg total) by mouth 2 (two) times daily. 08/20/15    Deatra Canter, FNP  amoxicillin (AMOXIL) 875 MG tablet Take 1 tablet (875 mg total) by mouth 2 (two) times daily. 08/20/15   Deatra Canter, FNP  lidocaine (XYLOCAINE) 2 % solution Use as directed 20 mLs in the mouth or throat every 4 (four) hours as needed for mouth pain. 08/20/15   Deatra Canter, FNP  metroNIDAZOLE (FLAGYL) 500 MG tablet Take 1 tablet (500 mg total) by mouth 2 (two) times daily. 03/31/15   Judeth Horn, NP   Meds Ordered and Administered this Visit  Medications - No data to display  BP 135/55   Pulse 99   Temp 99.2 F (37.3 C)   Resp 18   SpO2 99%  No data found.   Physical Exam  Constitutional: She appears well-developed and well-nourished.  HENT:  Head: Normocephalic and atraumatic.  Right Ear: External ear normal.  Left Ear: External ear normal.  OPX injected tonsils erythematous 2plus with exudates  Eyes: Conjunctivae and EOM are normal. Pupils are equal, round, and reactive to light.  Neck: Normal range of motion. Neck supple.  Cardiovascular: Normal rate and regular rhythm.   Pulmonary/Chest: Effort normal and breath sounds normal.  Abdominal: Soft. Bowel sounds are normal.    Urgent Care Course   Clinical Course    Procedures (including critical care time)  Labs Review Labs Reviewed  POCT  RAPID STREP A    Imaging Review No results found.   Visual Acuity Review  Right Eye Distance:   Left Eye Distance:   Bilateral Distance:    Right Eye Near:   Left Eye Near:    Bilateral Near:         MDM   1. Strep throat    Amoxicillin 875mg  one po bid x 7 days #14 Lidocaine viscous 2% 63ml po q4hours prn #173ml Tylenol and motrin otc as directed [employer] Gargles     Deatra Canter, FNP 08/20/15 1404    Deatra Canter, FNP 08/20/15 1406

## 2015-08-20 NOTE — ED Triage Notes (Signed)
Pt here for sore throat, fever, chills since this am.

## 2015-08-22 LAB — CULTURE, GROUP A STREP (THRC)

## 2015-08-24 ENCOUNTER — Telehealth (HOSPITAL_BASED_OUTPATIENT_CLINIC_OR_DEPARTMENT_OTHER): Payer: Self-pay | Admitting: *Deleted

## 2015-09-07 ENCOUNTER — Emergency Department (HOSPITAL_COMMUNITY)
Admission: EM | Admit: 2015-09-07 | Discharge: 2015-09-07 | Disposition: A | Discharge status: Home / Self Care | Payer: Medicaid Other | Attending: Emergency Medicine | Admitting: Emergency Medicine

## 2015-09-07 ENCOUNTER — Encounter (HOSPITAL_COMMUNITY): Payer: Self-pay | Admitting: Emergency Medicine

## 2015-09-07 DIAGNOSIS — F17210 Nicotine dependence, cigarettes, uncomplicated: Secondary | ICD-10-CM | POA: Insufficient documentation

## 2015-09-07 DIAGNOSIS — S0086XA Insect bite (nonvenomous) of other part of head, initial encounter: Secondary | ICD-10-CM | POA: Insufficient documentation

## 2015-09-07 DIAGNOSIS — Y999 Unspecified external cause status: Secondary | ICD-10-CM | POA: Insufficient documentation

## 2015-09-07 DIAGNOSIS — Y929 Unspecified place or not applicable: Secondary | ICD-10-CM | POA: Insufficient documentation

## 2015-09-07 DIAGNOSIS — Z79899 Other long term (current) drug therapy: Secondary | ICD-10-CM | POA: Insufficient documentation

## 2015-09-07 DIAGNOSIS — W57XXXA Bitten or stung by nonvenomous insect and other nonvenomous arthropods, initial encounter: Secondary | ICD-10-CM | POA: Insufficient documentation

## 2015-09-07 DIAGNOSIS — Y939 Activity, unspecified: Secondary | ICD-10-CM | POA: Insufficient documentation

## 2015-09-07 MED ORDER — DOXYCYCLINE HYCLATE 100 MG PO CAPS: 100.0000 mg | ORAL_CAPSULE | Freq: Two times a day (BID) | ORAL | 0 refills | Status: DC

## 2015-09-07 NOTE — ED Triage Notes (Addendum)
Yesterday felt bump under the skin on left face; feels irritation when she presses on it. No redness or raised area present.

## 2015-09-07 NOTE — ED Provider Notes (Signed)
[facility]-EMER[facility]ENCY DEPT Provider Note   CSN: [id number] Arrival date & time: 09/07/15  2057  First Provider Contact:  First MD Initiated Contact with Patient 09/07/15 2113     By signing my name below, I, Evelyn Mcclain, attest that this documentation has been prepared under the direction and in the presence of non-physician practitioner, [name], PA-C. Electronically Signed: Modena Mcclain, Scribe. 09/07/2015. 9:47 PM.  History   Chief Complaint Chief Complaint  Patient presents with  . Mass    The history is provided by the patient. No language interpreter was used.   HPI Comments: Evelyn Mcclain is a 20 y.o. female who presents to the Emergency Department complaining of a bump to the left side of her face that she noticed today. She states that she suspected an insect bite, and started noticing hardness and warmth to the site  later on. She states that she came to the ED today  a few times, but left before seeing a provider. She reports that she took tylenol and rested PTA without any relief.    She states that she is allergic to septra.   Past Medical History:  Diagnosis Date  . Chlamydia   . Heart murmur   . Hx of seasonal allergies     Patient Active Problem List   Diagnosis Date Noted  . Fall 08/30/2011  . 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 Subarachnoid hemorrhage following injury (HCC) 08/30/2011  . Polysubstance abuse 08/30/2011  . Acute blood loss anemia 08/30/2011    Past Surgical History:  Procedure Laterality Date  . NO PAST SURGERIES      OB History    Gravida Para Term Preterm AB Living   1       1     SAB TAB Ectopic Multiple Live Births   1               Home Medications    Prior to Admission medications   Medication Sig Start Date End Date Taking? Authorizing Provider  amoxicillin (AMOXIL) 875 MG tablet Take 1 tablet (875 mg total) by mouth 2 (two) times daily. 08/20/15   Deatra Canter, FNP  amoxicillin (AMOXIL) 875  MG tablet Take 1 tablet (875 mg total) by mouth 2 (two) times daily. 08/20/15   Deatra Canter, FNP  doxycycline (VIBRAMYCIN) 100 MG capsule Take 1 capsule (100 mg total) by mouth 2 (two) times daily. 09/07/15   [name], PA-C  lidocaine (XYLOCAINE) 2 % solution Use as directed 20 mLs in the mouth or throat every 4 (four) hours as needed for mouth pain. 08/20/15   Deatra Canter, FNP  metroNIDAZOLE (FLAGYL) 500 MG tablet Take 1 tablet (500 mg total) by mouth 2 (two) times daily. 03/31/15   Judeth Horn, NP    Family History Family History  Problem Relation Age of Onset  . Hypertension Mother   . Hypertension Maternal Aunt   . Hypertension Maternal Uncle   . Hypertension Maternal Grandmother   . Hypertension Maternal Grandfather   . Diabetes Paternal Grandmother   . Diabetes Paternal Grandfather     Social History Social History  Substance Use Topics  . Smoking status: Current Every Day Smoker    Packs/day: 0.25    Types: Cigarettes  . Smokeless tobacco: Never Used  . Alcohol use No     Allergies   Septra [bactrim]   Review of Systems Review of Systems  Constitutional: Negative for fever.  Skin: Positive for rash.  Physical Exam Updated Vital Signs BP 120/68 (BP Location: Right Arm)   Pulse 93   Temp 97.9 F (36.6 C) (Oral)   Resp 18   LMP  (Approximate) Comment: few weeks ago  SpO2 100%   Physical Exam  Constitutional: She is oriented to person, place, and time. No distress.  HENT:  Head: Normocephalic and atraumatic.  1x1 cm mildly erythematous bite on left temple, no abscess  Eyes: Conjunctivae and EOM are normal. Pupils are equal, round, and reactive to light.  Neck: No tracheal deviation present.  Cardiovascular: Normal rate.   Pulmonary/Chest: Effort normal. No respiratory distress.  Abdominal: Soft.  Musculoskeletal: Normal range of motion.  Neurological: She is alert and oriented to person, place, and time.  Skin: Skin is warm and dry.  She is not diaphoretic.  Psychiatric: Judgment normal.  Nursing note and vitals reviewed.    ED Treatments / Results  DIAGNOSTIC STUDIES: Oxygen Saturation is 100% on RA, normal by my interpretation.    COORDINATION OF CARE: 9:40 PM-Discussed treatment plan which includes with pt at bedside and pt agreed to plan.   Labs (all labs ordered are listed, but only abnormal results are displayed) Labs Reviewed - No data to display  EKG  EKG Interpretation None       Radiology No results found.  Procedures Procedures (including critical care time)  Medications Ordered in ED Medications - No data to display   Initial Impression / Assessment and Plan / ED Course  I have reviewed the triage vital signs and the nursing notes.  Pertinent labs & imaging results that were available during my care of the patient were reviewed by me and considered in my medical decision making (see chart for details).  Clinical Course    Patient with insect bite.  Mild swelling.  Mild erythema.  No abscess.  Doxy and home.  Return precautions given. MDM   Final Clinical Impressions(s) / ED Diagnoses   Final diagnoses:  Insect bite    New Prescriptions New Prescriptions   DOXYCYCLINE (VIBRAMYCIN) 100 MG CAPSULE    Take 1 capsule (100 mg total) by mouth 2 (two) times daily.   I personally performed the services described in this documentation, which was scribed in my presence. The recorded information has been reviewed and is accurate.       Roxy Horsemanobert Masayoshi Couzens, PA-C 09/07/15 2308    Doug SouSam Jacubowitz, MD 09/07/15 [phone]

## 2016-03-01 ENCOUNTER — Telehealth (HOSPITAL_COMMUNITY): Payer: Self-pay | Admitting: Emergency Medicine

## 2016-03-01 ENCOUNTER — Ambulatory Visit (HOSPITAL_COMMUNITY)
Admission: EM | Admit: 2016-03-01 | Discharge: 2016-03-01 | Disposition: A | Discharge status: Home / Self Care | Payer: BLUE CROSS/BLUE SHIELD | Attending: Internal Medicine | Admitting: Internal Medicine

## 2016-03-01 DIAGNOSIS — B3731 Acute candidiasis of vulva and vagina: Secondary | ICD-10-CM

## 2016-03-01 DIAGNOSIS — B373 Candidiasis of vulva and vagina: Secondary | ICD-10-CM | POA: Diagnosis not present

## 2016-03-01 DIAGNOSIS — R6889 Other general symptoms and signs: Secondary | ICD-10-CM | POA: Diagnosis not present

## 2016-03-01 DIAGNOSIS — R5081 Fever presenting with conditions classified elsewhere: Secondary | ICD-10-CM | POA: Diagnosis not present

## 2016-03-01 DIAGNOSIS — Z79899 Other long term (current) drug therapy: Secondary | ICD-10-CM | POA: Insufficient documentation

## 2016-03-01 DIAGNOSIS — J111 Influenza due to unidentified influenza virus with other respiratory manifestations: Secondary | ICD-10-CM | POA: Diagnosis not present

## 2016-03-01 DIAGNOSIS — J029 Acute pharyngitis, unspecified: Secondary | ICD-10-CM | POA: Diagnosis present

## 2016-03-01 DIAGNOSIS — R509 Fever, unspecified: Secondary | ICD-10-CM | POA: Diagnosis not present

## 2016-03-01 DIAGNOSIS — F17210 Nicotine dependence, cigarettes, uncomplicated: Secondary | ICD-10-CM | POA: Insufficient documentation

## 2016-03-01 LAB — POCT RAPID STREP A: Streptococcus, Group A Screen (Direct): NEGATIVE

## 2016-03-01 MED ORDER — IBUPROFEN 800 MG PO TABS
800.0000 mg | ORAL_TABLET | Freq: Once | ORAL | Status: AC
  Administered 2016-03-01: 800 mg via ORAL

## 2016-03-01 MED ORDER — OSELTAMIVIR PHOSPHATE 75 MG PO CAPS: 75.0000 mg | ORAL_CAPSULE | Freq: Two times a day (BID) | ORAL | 0 refills | Status: DC

## 2016-03-01 MED ORDER — FLUCONAZOLE 200 MG PO TABS: 200.0000 mg | ORAL_TABLET | Freq: Every day | ORAL | 0 refills | Status: AC

## 2016-03-01 MED ORDER — IBUPROFEN 800 MG PO TABS
ORAL_TABLET | ORAL | Status: AC
  Filled 2016-03-01: qty 1

## 2016-03-01 MED ORDER — IBUPROFEN 800 MG PO TABS: 800.0000 mg | ORAL_TABLET | Freq: Four times a day (QID) | ORAL | 0 refills | Status: DC | PRN

## 2016-03-01 NOTE — Telephone Encounter (Signed)
Called in 3 scripts as escribed to [facility].  Pharmacy reports there has been issues with escribing ability earlier today.  Scripts called-tamiflu, ibuprofen, diflucan.  [facility]

## 2016-03-01 NOTE — ED Triage Notes (Addendum)
C/o sore throat for two days otc meds taking  States she has had strep before  C/o yeast infection for a month States discharge is white and thick

## 2016-03-01 NOTE — ED Provider Notes (Signed)
CSN: [id number]     Arrival date & time 03/01/16  1336 History   First MD Initiated Contact with Patient 03/01/16 1653     Chief Complaint  Patient presents with  . Sore Throat  . Vaginitis   (Consider location/radiation/quality/duration/timing/severity/associated sxs/prior Treatment) Pt c/o body aches, fever, nausea intermit for 3 days . No v/d. States that she has not taken anything for this. Did not obtain the flu shot this year.   Also states that she has intmeit yeast infections due to she wears tight clothes and changes soaps that causes itching to vaginal area. Denies any vaginal discharge, no foul odor. Denies that she has  An possible STI. Ask if she can have diflucan for this. Has had for approx 1 month. Has not taken anything for this. Denies any abd pain.       Past Medical History:  Diagnosis Date  . Chlamydia   . Heart murmur   . Hx of seasonal allergies    Past Surgical History:  Procedure Laterality Date  . NO PAST SURGERIES     Family History  Problem Relation Age of Onset  . Hypertension Mother   . Hypertension Maternal Aunt   . Hypertension Maternal Uncle   . Hypertension Maternal Grandmother   . Hypertension Maternal Grandfather   . Diabetes Paternal Grandmother   . Diabetes Paternal Grandfather    Social History  Substance Use Topics  . Smoking status: Current Every Day Smoker    Packs/day: 0.25    Types: Cigarettes  . Smokeless tobacco: Never Used  . Alcohol use No   OB History    Gravida Para Term Preterm AB Living   1       1     SAB TAB Ectopic Multiple Live Births   1             Review of Systems  Constitutional: Positive for chills, fatigue and fever.  HENT: Positive for congestion and sore throat.   Eyes: Negative.   Respiratory: Positive for cough.   Cardiovascular: Negative.   Gastrointestinal: Negative.   Endocrine: Negative.   Genitourinary:       External vaginal itching   Musculoskeletal:       Generalized body aches    Skin:       Warm to tocuh   Neurological: Negative.     Allergies  Septra [bactrim]  Home Medications   Prior to Admission medications   Medication Sig Start Date End Date Taking? Authorizing Provider  amoxicillin (AMOXIL) 875 MG tablet Take 1 tablet (875 mg total) by mouth 2 (two) times daily. 08/20/15   Deatra Canter, FNP  amoxicillin (AMOXIL) 875 MG tablet Take 1 tablet (875 mg total) by mouth 2 (two) times daily. 08/20/15   Deatra Canter, FNP  doxycycline (VIBRAMYCIN) 100 MG capsule Take 1 capsule (100 mg total) by mouth 2 (two) times daily. 09/07/15   [name], PA-C  fluconazole (DIFLUCAN) 200 MG tablet Take 1 tablet (200 mg total) by mouth daily. 03/01/16 03/08/16  Tobi Bastos, NP  ibuprofen (ADVIL,MOTRIN) 800 MG tablet Take 1 tablet (800 mg total) by mouth every 6 (six) hours as needed. 03/01/16   Tobi Bastos, NP  lidocaine (XYLOCAINE) 2 % solution Use as directed 20 mLs in the mouth or throat every 4 (four) hours as needed for mouth pain. 08/20/15   Deatra Canter, FNP  metroNIDAZOLE (FLAGYL) 500 MG tablet Take 1 tablet (500 mg total) by mouth  2 (two) times daily. 03/31/15   Judeth HornErin Lawrence, NP  oseltamivir (TAMIFLU) 75 MG capsule Take 1 capsule (75 mg total) by mouth every 12 (twelve) hours. 03/01/16   Tobi BastosMelanie A Damarius Karnes, NP   Meds Ordered and Administered this Visit   Medications  ibuprofen (ADVIL,MOTRIN) tablet 800 mg (not administered)    BP 120/77 (BP Location: Right Arm)   Pulse 102   Temp 101 F (38.3 C) (Oral)   Resp 16   LMP 03/01/2016   SpO2 100%  No data found.   Physical Exam  Constitutional: She appears well-developed.  HENT:  Right Ear: External ear normal.  Left Ear: External ear normal.  Eyes: Pupils are equal, round, and reactive to light.  Neck: Normal range of motion.  Cardiovascular: Normal rate and regular rhythm.   Pulmonary/Chest: Effort normal and breath sounds normal.  Abdominal: Soft.  Genitourinary: Vagina normal.   Musculoskeletal: Normal range of motion.  Neurological: She is alert.  Skin: Capillary refill takes less than 2 seconds.    Urgent Care Course     Procedures (including critical care time)  Labs Review Labs Reviewed  POCT RAPID STREP A    Imaging Review No results found.           MDM   1. Influenza-like symptoms   2. Fever in other diseases   3. Vaginal yeast infection    You have symptoms of the flu.  You need to wash hands well  Stay hydrated The medication will not take the flu away it will help with the symptoms. Do not go to work if you have a fever You have a yeast infection avoid tight clothes and new soaps that cause the itching to vaginal area.     Tobi BastosMelanie A Gid Schoffstall, NP 03/01/16 1704

## 2016-03-01 NOTE — Discharge Instructions (Signed)
You have symptoms of the flu.  You need to wash hands well  Stay hydrated The medication will not take the flu away it will help with the symptoms. Do not go to work if you have a fever You have a yeast infection avoid tight clothes and new soaps that cause the itching to vaginal area.

## 2016-03-02 LAB — CULTURE, GROUP A STREP (THRC)

## 2016-03-03 ENCOUNTER — Telehealth (HOSPITAL_COMMUNITY): Payer: Self-pay | Admitting: Internal Medicine

## 2016-03-03 MED ORDER — PENICILLIN V POTASSIUM 500 MG PO TABS: 500.0000 mg | ORAL_TABLET | Freq: Two times a day (BID) | ORAL | 0 refills | Status: DC

## 2016-03-03 NOTE — Telephone Encounter (Signed)
Please let patient know that throat culture was positive for non group A strep.  Rx for penicillin V sent to [facility] record, [facility] [address].  Recheck for further evaluation if symptoms are not improving.  LM

## 2017-01-25 NOTE — L&D Delivery Note (Signed)
Pt progressed rapidly after receiving epidural.  10 minute 2nd stage.    Delivery Note At 8:15 AM a viable female was delivered via Vaginal, Spontaneous (Presentation: ; LOA ).  APGAR:pending per NICU , ; weight 3 lb 13.4 oz (1740 g).    Cord pH: drawn but not resulted (? Enough blood). THe baby was somewhat slow to perk up. So fter about 30 seconds, the cord was milked towards the umbilicus, the cord was clamped and cut.   The placenta separated spontaneously and delivered via CCT and maternal pushing effort.  It was inspected and appears to be intact with a 3 VC.  40 units of pitocin diluted in 1000cc LR was infused rapidly IV.  Anesthesia:  epidural Episiotomy: None Lacerations: None Suture Repair:  Est. Blood Loss (mL):  150  Mom to postpartum.  Baby to NICU.   Delivery by Maryla MorrowLindsay Mitchell, PA-S under my direct supervision and guidance Jacklyn ShellFrances Cresenzo-Dishmon 08/12/2017, 9:03 AM

## 2017-02-13 ENCOUNTER — Encounter (HOSPITAL_COMMUNITY): Payer: Self-pay

## 2017-02-13 ENCOUNTER — Inpatient Hospital Stay (HOSPITAL_COMMUNITY)
Admission: AD | Admit: 2017-02-13 | Discharge: 2017-02-13 | Disposition: A | Discharge status: Home / Self Care | Payer: BLUE CROSS/BLUE SHIELD | Source: Ambulatory Visit | Attending: Obstetrics and Gynecology | Admitting: Obstetrics and Gynecology

## 2017-02-13 DIAGNOSIS — F17210 Nicotine dependence, cigarettes, uncomplicated: Secondary | ICD-10-CM | POA: Diagnosis not present

## 2017-02-13 DIAGNOSIS — O23591 Infection of other part of genital tract in pregnancy, first trimester: Secondary | ICD-10-CM | POA: Insufficient documentation

## 2017-02-13 DIAGNOSIS — O99331 Smoking (tobacco) complicating pregnancy, first trimester: Secondary | ICD-10-CM | POA: Insufficient documentation

## 2017-02-13 DIAGNOSIS — B9689 Other specified bacterial agents as the cause of diseases classified elsewhere: Secondary | ICD-10-CM | POA: Insufficient documentation

## 2017-02-13 DIAGNOSIS — O219 Vomiting of pregnancy, unspecified: Secondary | ICD-10-CM

## 2017-02-13 DIAGNOSIS — O210 Mild hyperemesis gravidarum: Secondary | ICD-10-CM | POA: Insufficient documentation

## 2017-02-13 DIAGNOSIS — Z3A08 8 weeks gestation of pregnancy: Secondary | ICD-10-CM | POA: Insufficient documentation

## 2017-02-13 DIAGNOSIS — M549 Dorsalgia, unspecified: Secondary | ICD-10-CM | POA: Diagnosis present

## 2017-02-13 DIAGNOSIS — Z3201 Encounter for pregnancy test, result positive: Secondary | ICD-10-CM

## 2017-02-13 DIAGNOSIS — N760 Acute vaginitis: Secondary | ICD-10-CM

## 2017-02-13 LAB — WET PREP, GENITAL
Sperm: NONE SEEN
Trich, Wet Prep: NONE SEEN
Yeast Wet Prep HPF POC: NONE SEEN

## 2017-02-13 LAB — URINALYSIS, ROUTINE W REFLEX MICROSCOPIC
BILIRUBIN URINE: NEGATIVE
Glucose, UA: NEGATIVE mg/dL
HGB URINE DIPSTICK: NEGATIVE
KETONES UR: NEGATIVE mg/dL
Leukocytes, UA: NEGATIVE
Nitrite: NEGATIVE
PROTEIN: NEGATIVE mg/dL
Specific Gravity, Urine: 1.027 (ref 1.005–1.030)
pH: 7 (ref 5.0–8.0)

## 2017-02-13 LAB — POCT PREGNANCY, URINE: Preg Test, Ur: POSITIVE — AB

## 2017-02-13 MED ORDER — PROMETHAZINE HCL 12.5 MG PO TABS: 12.5000 mg | ORAL_TABLET | Freq: Four times a day (QID) | ORAL | 0 refills | Status: DC | PRN

## 2017-02-13 MED ORDER — METRONIDAZOLE 500 MG PO TABS: 500.0000 mg | ORAL_TABLET | Freq: Two times a day (BID) | ORAL | 0 refills | Status: DC

## 2017-02-13 NOTE — Discharge Instructions (Signed)
°[facility]  ° ° °[facility]       Phone: [phone] ° °[facility] Phone: [phone] ° °[facility] [place]  Phone: [phone] ° °[facility] [place]  Phone: [phone] ° °[facility] [place]  Phone: [phone] ° °[facility]       Phone: [phone] ° °[facility]    Phone: [phone]  ° °[facility]    Phone: [phone] ° °[facility]    Phone: [phone] ° °[facility]                                     Phone: [phone] ° °[facility]    Phone: [phone] ° °[facility]    Phone: [phone] ° °[facility]       Phone: [phone]  ° °[facility]  Phone: [phone] ° °[facility]    Phone: [phone] ° °[facility]   Phone: [phone] ° °[facility]      Phone: [phone] ° °[facility]    Phone: [phone] ° ° ° ° °Bacterial Vaginosis °Bacterial vaginosis is a vaginal infection that occurs when the normal balance of bacteria in the vagina is disrupted. It results from an overgrowth of certain bacteria. This is the most common vaginal infection among women ages 15-44. °Because bacterial vaginosis increases your risk for STIs (sexually transmitted infections), getting treated can help reduce your risk for chlamydia, gonorrhea, herpes, and HIV (human immunodeficiency virus). Treatment is also important for preventing complications in pregnant women, because this condition can cause an early (premature) delivery. °What are the causes? °This condition is caused by an increase in harmful bacteria that are normally present in small  amounts in the vagina. However, the reason that the condition develops is not fully understood. °What increases the risk? °The following factors may make you more likely to develop this condition: °· Having a new sexual partner or multiple sexual partners. °· Having unprotected sex. °· Douching. °· Having an intrauterine device (IUD). °· Smoking. °· Drug and alcohol abuse. °· Taking certain antibiotic medicines. °· Being pregnant. ° °You cannot get bacterial vaginosis from toilet seats, bedding, swimming pools, or contact with objects around you. °What are the signs or symptoms? °Symptoms of this condition include: °· Grey or white vaginal discharge. The discharge can also be watery or foamy. °· A fish-like odor with discharge, especially after sexual intercourse or during menstruation. °· Itching in and around the vagina. °· Burning or pain with urination. ° °Some women with bacterial vaginosis have no signs or symptoms. °How is this diagnosed? °This condition is diagnosed based on: °· Your medical history. °· A physical exam of the vagina. °· Testing a sample of vaginal fluid under a microscope to look for a large amount of bad bacteria or abnormal cells. Your health care provider may use a cotton swab or a small wooden spatula to collect the sample. ° °How is this treated? °This condition is treated with antibiotics. These may be given as a pill, a vaginal cream, or a medicine that is put into the vagina (suppository). If the condition comes back after treatment, a second round of antibiotics may be needed. °Follow these   instructions at home: °Medicines °· Take over-the-counter and prescription medicines only as told by your health care provider. °· Take or use your antibiotic as told by your health care provider. Do not stop taking or using the antibiotic even if you start to feel better. °General instructions °· If you have a female sexual partner, tell her that you have a vaginal infection. She should see her  health care provider and be treated if she has symptoms. If you have a female sexual partner, he does not need treatment. °· During treatment: °? Avoid sexual activity until you finish treatment. °? Do not douche. °? Avoid alcohol as directed by your health care provider. °? Avoid breastfeeding as directed by your health care provider. °· Drink enough water and fluids to keep your urine clear or pale yellow. °· Keep the area around your vagina and rectum clean. °? Wash the area daily with warm water. °? Wipe yourself from front to back after using the toilet. °· Keep all follow-up visits as told by your health care provider. This is important. °How is this prevented? °· Do not douche. °· Wash the outside of your vagina with warm water only. °· Use protection when having sex. This includes latex condoms and dental dams. °· Limit how many sexual partners you have. To help prevent bacterial vaginosis, it is best to have sex with just one partner (monogamous). °· Make sure you and your sexual partner are tested for STIs. °· Wear cotton or cotton-lined underwear. °· Avoid wearing tight pants and pantyhose, especially during summer. °· Limit the amount of alcohol that you drink. °· Do not use any products that contain nicotine or tobacco, such as cigarettes and e-cigarettes. If you need help quitting, ask your health care provider. °· Do not use illegal drugs. °Where to find more information: °· Centers for Disease Control and Prevention: www.cdc.gov/std °· American Sexual Health Association (ASHA): www.ashastd.org °· U.S. Department of Health and Human Services, Office on Women's Health: www.womenshealth.gov/ or https://www.womenshealth.gov/a-z-topics/bacterial-vaginosis °Contact a health care provider if: °· Your symptoms do not improve, even after treatment. °· You have more discharge or pain when urinating. °· You have a fever. °· You have pain in your abdomen. °· You have pain during sex. °· You have vaginal bleeding  between periods. °Summary °· Bacterial vaginosis is a vaginal infection that occurs when the normal balance of bacteria in the vagina is disrupted. °· Because bacterial vaginosis increases your risk for STIs (sexually transmitted infections), getting treated can help reduce your risk for chlamydia, gonorrhea, herpes, and HIV (human immunodeficiency virus). Treatment is also important for preventing complications in pregnant women, because the condition can cause an early (premature) delivery. °· This condition is treated with antibiotic medicines. These may be given as a pill, a vaginal cream, or a medicine that is put into the vagina (suppository). °This information is not intended to replace advice given to you by your health care provider. Make sure you discuss any questions you have with your health care provider. °Document Released: 01/11/2005 Document Revised: 05/17/2016 Document Reviewed: 09/27/2015 °Elsevier Interactive Patient Education © 2018 [employer] ° °

## 2017-02-13 NOTE — MAU Note (Signed)
Pt states LMP 12/15/2016. Reports generalized back pain that started tonight. States she has not taken anything for pain. Pt reports nausea and vomiting yesterday but none today. Pt denies vaginal bleeding or discharge. Pt states she had +upt at home 1 week ago. Pt very emotional and crying in triage.

## 2017-02-13 NOTE — MAU Provider Note (Signed)
History     CSN: [id number]  Arrival date and time: 02/13/17 2015   First Provider Initiated Contact with Patient 02/13/17 2041      Chief Complaint  Patient presents with  . Back Pain  . Possible Pregnancy   HPI  Evelyn Mcclain is a 22 y.o. G2P0010 at 4213w4d by LMP who presents for possible pregnancy & back pain. Her LMP was 12/15/16. Took a pregnancy test when she missed her period & it was positive but she was still unsure she was pregnant. States she has been stressed out & is tearful during our discussion but won't divulge why she is stressed out. States since she started crying she has had mid back pani & a headache. Rates pain 6/10. Describes as constant aching. Has not treated pain. Crying makes pain worse. Reports vomiting a few times yesterday but denies n/v since yesterday. Denies abdominal pain, dysuria, vaginal bleeding, or diarrhea. States she feels like she has BV or yeast infection due to irritation & vaginal discharge but can't describe the discharge. She is in monogamous relationship x 1 year & denies concern for STI with this partner.  Denies SI/HI.   OB History    Gravida Para Term Preterm AB Living   2       1     SAB TAB Ectopic Multiple Live Births   1              Past Medical History:  Diagnosis Date  . Chlamydia   . Heart murmur   . Hx of seasonal allergies     Past Surgical History:  Procedure Laterality Date  . NO PAST SURGERIES      Family History  Problem Relation Age of Onset  . Hypertension Mother   . Hypertension Maternal Aunt   . Hypertension Maternal Uncle   . Hypertension Maternal Grandmother   . Hypertension Maternal Grandfather   . Diabetes Paternal Grandmother   . Diabetes Paternal Grandfather     Social History   Tobacco Use  . Smoking status: Current Every Day Smoker    Packs/day: 0.25    Types: Cigarettes  . Smokeless tobacco: Never Used  Substance Use Topics  . Alcohol use: No  . Drug use: No    Comment: 1 month ago     Allergies:  Allergies  Allergen Reactions  . Septra [Bactrim] Rash    Medications Prior to Admission  Medication Sig Dispense Refill Last Dose  . amoxicillin (AMOXIL) 875 MG tablet Take 1 tablet (875 mg total) by mouth 2 (two) times daily. 14 tablet 0 More than a month at Unknown time  . amoxicillin (AMOXIL) 875 MG tablet Take 1 tablet (875 mg total) by mouth 2 (two) times daily. 14 tablet 0 More than a month at Unknown time  . doxycycline (VIBRAMYCIN) 100 MG capsule Take 1 capsule (100 mg total) by mouth 2 (two) times daily. 20 capsule 0 More than a month at Unknown time  . ibuprofen (ADVIL,MOTRIN) 800 MG tablet Take 1 tablet (800 mg total) by mouth every 6 (six) hours as needed. 21 tablet 0   . lidocaine (XYLOCAINE) 2 % solution Use as directed 20 mLs in the mouth or throat every 4 (four) hours as needed for mouth pain. 100 mL 0 More than a month at Unknown time  . metroNIDAZOLE (FLAGYL) 500 MG tablet Take 1 tablet (500 mg total) by mouth 2 (two) times daily. 14 tablet 0 More than a month at Unknown time  .  oseltamivir (TAMIFLU) 75 MG capsule Take 1 capsule (75 mg total) by mouth every 12 (twelve) hours. 10 capsule 0   . penicillin v potassium (VEETID) 500 MG tablet Take 1 tablet (500 mg total) by mouth 2 (two) times daily. 20 tablet 0     Review of Systems  Constitutional: Negative.   Gastrointestinal: Negative.   Genitourinary: Positive for vaginal discharge. Negative for dysuria, frequency, hematuria and vaginal bleeding.  Musculoskeletal: Positive for back pain.  Neurological: Positive for headaches.  Psychiatric/Behavioral: Negative for suicidal ideas.   Physical Exam   Blood pressure 119/71, pulse 88, temperature 98.5 F (36.9 C), temperature source Oral, resp. rate 19, height 5\' 7"  (1.702 m), weight 145 lb (65.8 kg), last menstrual period 12/15/2016, SpO2 100 %.  Physical Exam  Nursing note and vitals reviewed. Constitutional: She is oriented to person, place, and  time. She appears well-developed and well-nourished. No distress.  HENT:  Head: Normocephalic and atraumatic.  Eyes: Conjunctivae are normal. Right eye exhibits no discharge. Left eye exhibits no discharge. No scleral icterus.  Neck: Normal range of motion.  Respiratory: Effort normal. No respiratory distress.  GI: Soft. There is no tenderness. There is no rebound and no guarding.  Musculoskeletal:       Thoracic back: Normal.       Lumbar back: Normal.  Neurological: She is alert and oriented to person, place, and time.  Skin: Skin is warm and dry. She is not diaphoretic.  Psychiatric: She has a normal mood and affect. Her behavior is normal. Judgment and thought content normal.    MAU Course  Procedures Results for orders placed or performed during the hospital encounter of 02/13/17 (from the past 24 hour(s))  Urinalysis, Routine w reflex microscopic     Status: Abnormal   Collection Time: 02/13/17  8:26 PM  Result Value Ref Range   Color, Urine YELLOW YELLOW   APPearance CLOUDY (A) CLEAR   Specific Gravity, Urine 1.027 1.005 - 1.030   pH 7.0 5.0 - 8.0   Glucose, UA NEGATIVE NEGATIVE mg/dL   Hgb urine dipstick NEGATIVE NEGATIVE   Bilirubin Urine NEGATIVE NEGATIVE   Ketones, ur NEGATIVE NEGATIVE mg/dL   Protein, ur NEGATIVE NEGATIVE mg/dL   Nitrite NEGATIVE NEGATIVE   Leukocytes, UA NEGATIVE NEGATIVE  Pregnancy, urine POC     Status: Abnormal   Collection Time: 02/13/17  8:47 PM  Result Value Ref Range   Preg Test, Ur POSITIVE (A) NEGATIVE  Wet prep, genital     Status: Abnormal   Collection Time: 02/13/17  9:03 PM  Result Value Ref Range   Yeast Wet Prep HPF POC NONE SEEN NONE SEEN   Trich, Wet Prep NONE SEEN NONE SEEN   Clue Cells Wet Prep HPF POC PRESENT (A) NONE SEEN   WBC, Wet Prep HPF POC FEW (A) NONE SEEN   Sperm NONE SEEN     MDM UPT positive Denies abdominal pain or vaginal bleeding Declines tylenol for back pain GC/CT & wet prep collected by blind swab --  pt declines pelvic exam or bimanual exam  Assessment and Plan  A: 1. Positive pregnancy test   2. BV (bacterial vaginosis)   3. Nausea/vomiting in pregnancy    P: Discharge home Rx flagyl & phenergan Start prenatal care GC/CT pending  Judeth Horn 02/13/2017, 8:42 PM

## 2017-02-14 LAB — GC/CHLAMYDIA PROBE AMP (~~LOC~~) NOT AT ARMC
Chlamydia: NEGATIVE
Neisseria Gonorrhea: NEGATIVE

## 2017-03-14 ENCOUNTER — Ambulatory Visit (INDEPENDENT_AMBULATORY_CARE_PROVIDER_SITE_OTHER): Payer: BLUE CROSS/BLUE SHIELD | Admitting: Obstetrics & Gynecology

## 2017-03-14 ENCOUNTER — Encounter: Payer: Self-pay | Admitting: Obstetrics & Gynecology

## 2017-03-14 DIAGNOSIS — F1290 Cannabis use, unspecified, uncomplicated: Secondary | ICD-10-CM | POA: Diagnosis not present

## 2017-03-14 DIAGNOSIS — Z3400 Encounter for supervision of normal first pregnancy, unspecified trimester: Secondary | ICD-10-CM | POA: Insufficient documentation

## 2017-03-14 DIAGNOSIS — Z3402 Encounter for supervision of normal first pregnancy, second trimester: Secondary | ICD-10-CM

## 2017-03-14 DIAGNOSIS — Z113 Encounter for screening for infections with a predominantly sexual mode of transmission: Secondary | ICD-10-CM | POA: Diagnosis not present

## 2017-03-14 LAB — POCT URINALYSIS DIP (DEVICE)
BILIRUBIN URINE: NEGATIVE
Glucose, UA: NEGATIVE mg/dL
LEUKOCYTES UA: NEGATIVE
NITRITE: NEGATIVE
PH: 7 (ref 5.0–8.0)
Protein, ur: 30 mg/dL — AB
Specific Gravity, Urine: 1.025 (ref 1.005–1.030)
Urobilinogen, UA: 1 mg/dL (ref 0.0–1.0)

## 2017-03-14 LAB — OB RESULTS CONSOLE RUBELLA ANTIBODY, IGM: Rubella: IMMUNE

## 2017-03-14 LAB — OB RESULTS CONSOLE GC/CHLAMYDIA: Gonorrhea: NEGATIVE

## 2017-03-14 MED ORDER — PRENATAL VITAMINS 0.8 MG PO TABS
1.0000 | ORAL_TABLET | Freq: Every day | ORAL | 12 refills | Status: DC
Start: 2017-03-14 — End: 2019-01-06

## 2017-03-14 NOTE — Progress Notes (Signed)
  Subjective:declined physical exam today    Evelyn Mcclain is a G1P0000 2220w5d being seen today for her first obstetrical visit.  Her obstetrical history is significant for first pregnancy. Patient is not sure if she will intend to breast feed. Pregnancy history fully reviewed.  Patient reports nausea and vomiting.  Vitals:   03/14/17 1529  BP: 120/63  Pulse: 79  Weight: 149 lb 6.4 oz (67.8 kg)    HISTORY: OB History  Gravida Para Term Preterm AB Living  1       0    SAB TAB Ectopic Multiple Live Births  0            # Outcome Date GA Lbr Len/2nd Weight Sex Delivery Anes PTL Lv  1 Current              Past Medical History:  Diagnosis Date  . Chlamydia   . Heart murmur   . Hx of seasonal allergies    Past Surgical History:  Procedure Laterality Date  . NO PAST SURGERIES     Family History  Problem Relation Age of Onset  . Hypertension Mother   . Hypertension Maternal Aunt   . Hypertension Maternal Uncle   . Hypertension Maternal Grandmother   . Hypertension Maternal Grandfather   . Diabetes Paternal Grandmother   . Diabetes Paternal Grandfather      Exam    Uterus:   13  Pelvic Exam: Deferred per her request                                   Skin: normal coloration and turgor, no rashes    Neurologic: oriented, mildly depressed affect   Extremities: normal strength, tone, and muscle mass   HEENT PERRLA and sclera clear, anicteric   Mouth/Teeth mucous membranes moist, pharynx normal without lesions and dental hygiene good   Neck supple   Cardiovascular: regular rate and rhythm, no murmurs or gallops   Respiratory:  appears well, vitals normal, no respiratory distress, acyanotic, normal RR, chest clear, no wheezing, crepitations, rhonchi, normal symmetric air entry   Abdomen: soft, non-tender; bowel sounds normal; no masses,  no organomegaly          Assessment:    Pregnancy: G1P0000 Patient Active Problem List   Diagnosis Date Noted  .  Supervision of normal first pregnancy, antepartum 03/14/2017  . 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 Subarachnoid hemorrhage following injury (HCC) 08/30/2011  . Polysubstance abuse (HCC) 08/30/2011  Impatient and does not want complete initial exam today      Plan:     Initial labs drawn. Prenatal vitamins. Problem list reviewed and updated. Genetic Screening recommend discuss next visit   Ultrasound discussed; fetal survey: 18 weeks.  Follow up in 3 weeks. 50% of 30 min visit spent on counseling and coordination of care.  Drug screen ordered and I recommend [facility]   Scheryl DarterJames Makina Skow 03/14/2017

## 2017-03-14 NOTE — Patient Instructions (Signed)
First Trimester of Pregnancy The first trimester of pregnancy is from week 1 until the end of week 13 (months 1 through 3). A week after a sperm fertilizes an egg, the egg will implant on the wall of the uterus. This embryo will begin to develop into a baby. Genes from you and your partner will form the baby. The female genes will determine whether the baby will be a boy or a girl. At 6-8 weeks, the eyes and face will be formed, and the heartbeat can be seen on ultrasound. At the end of 12 weeks, all the baby's organs will be formed. Now that you are pregnant, you will want to do everything you can to have a healthy baby. Two of the most important things are to get good prenatal care and to follow your health care provider's instructions. Prenatal care is all the medical care you receive before the baby's birth. This care will help prevent, find, and treat any problems during the pregnancy and childbirth. Body changes during your first trimester Your body goes through many changes during pregnancy. The changes vary from woman to woman.  You may gain or lose a couple of pounds at first.  You may feel sick to your stomach (nauseous) and you may throw up (vomit). If the vomiting is uncontrollable, call your health care provider.  You may tire easily.  You may develop headaches that can be relieved by medicines. All medicines should be approved by your health care provider.  You may urinate more often. Painful urination may mean you have a bladder infection.  You may develop heartburn as a result of your pregnancy.  You may develop constipation because certain hormones are causing the muscles that push stool through your intestines to slow down.  You may develop hemorrhoids or swollen veins (varicose veins).  Your breasts may begin to grow larger and become tender. Your nipples may stick out more, and the tissue that surrounds them (areola) may become darker.  Your gums may bleed and may be  sensitive to brushing and flossing.  Dark spots or blotches (chloasma, mask of pregnancy) may develop on your face. This will likely fade after the baby is born.  Your menstrual periods will stop.  You may have a loss of appetite.  You may develop cravings for certain kinds of food.  You may have changes in your emotions from day to day, such as being excited to be pregnant or being concerned that something may go wrong with the pregnancy and baby.  You may have more vivid and strange dreams.  You may have changes in your hair. These can include thickening of your hair, rapid growth, and changes in texture. Some women also have hair loss during or after pregnancy, or hair that feels dry or thin. Your hair will most likely return to normal after your baby is born.  What to expect at prenatal visits During a routine prenatal visit:  You will be weighed to make sure you and the baby are growing normally.  Your blood pressure will be taken.  Your abdomen will be measured to track your baby's growth.  The fetal heartbeat will be listened to between weeks 10 and 14 of your pregnancy.  Test results from any previous visits will be discussed.  Your health care provider may ask you:  How you are feeling.  If you are feeling the baby move.  If you have had any abnormal symptoms, such as leaking fluid, bleeding, severe headaches,   or abdominal cramping.  If you are using any tobacco products, including cigarettes, chewing tobacco, and electronic cigarettes.  If you have any questions.  Other tests that may be performed during your first trimester include:  Blood tests to find your blood type and to check for the presence of any previous infections. The tests will also be used to check for low iron levels (anemia) and protein on red blood cells (Rh antibodies). Depending on your risk factors, or if you previously had diabetes during pregnancy, you may have tests to check for high blood  sugar that affects pregnant women (gestational diabetes).  Urine tests to check for infections, diabetes, or protein in the urine.  An ultrasound to confirm the proper growth and development of the baby.  Fetal screens for spinal cord problems (spina bifida) and Down syndrome.  HIV (human immunodeficiency virus) testing. Routine prenatal testing includes screening for HIV, unless you choose not to have this test.  You may need other tests to make sure you and the baby are doing well.  Follow these instructions at home: Medicines  Follow your health care provider's instructions regarding medicine use. Specific medicines may be either safe or unsafe to take during pregnancy.  Take a prenatal vitamin that contains at least 600 micrograms (mcg) of folic acid.  If you develop constipation, try taking a stool softener if your health care provider approves. Eating and drinking  Eat a balanced diet that includes fresh fruits and vegetables, whole grains, good sources of protein such as meat, eggs, or tofu, and low-fat dairy. Your health care provider will help you determine the amount of weight gain that is right for you.  Avoid raw meat and uncooked cheese. These carry germs that can cause birth defects in the baby.  Eating four or five small meals rather than three large meals a day may help relieve nausea and vomiting. If you start to feel nauseous, eating a few soda crackers can be helpful. Drinking liquids between meals, instead of during meals, also seems to help ease nausea and vomiting.  Limit foods that are high in fat and processed sugars, such as fried and sweet foods.  To prevent constipation: ? Eat foods that are high in fiber, such as fresh fruits and vegetables, whole grains, and beans. ? Drink enough fluid to keep your urine clear or pale yellow. Activity  Exercise only as directed by your health care provider. Most women can continue their usual exercise routine during  pregnancy. Try to exercise for 30 minutes at least 5 days a week. Exercising will help you: ? Control your weight. ? Stay in shape. ? Be prepared for labor and delivery.  Experiencing pain or cramping in the lower abdomen or lower back is a good sign that you should stop exercising. Check with your health care provider before continuing with normal exercises.  Try to avoid standing for long periods of time. Move your legs often if you must stand in one place for a long time.  Avoid heavy lifting.  Wear low-heeled shoes and practice good posture.  You may continue to have sex unless your health care provider tells you not to. Relieving pain and discomfort  Wear a good support bra to relieve breast tenderness.  Take warm sitz baths to soothe any pain or discomfort caused by hemorrhoids. Use hemorrhoid cream if your health care provider approves.  Rest with your legs elevated if you have leg cramps or low back pain.  If you develop   varicose veins in your legs, wear support hose. Elevate your feet for 15 minutes, 3-4 times a day. Limit salt in your diet. Prenatal care  Schedule your prenatal visits by the twelfth week of pregnancy. They are usually scheduled monthly at first, then more often in the last 2 months before delivery.  Write down your questions. Take them to your prenatal visits.  Keep all your prenatal visits as told by your health care provider. This is important. Safety  Wear your seat belt at all times when driving.  Make a list of emergency phone numbers, including numbers for family, friends, the hospital, and police and fire departments. General instructions  Ask your health care provider for a referral to a local prenatal education class. Begin classes no later than the beginning of month 6 of your pregnancy.  Ask for help if you have counseling or nutritional needs during pregnancy. Your health care provider can offer advice or refer you to specialists for help  with various needs.  Do not use hot tubs, steam rooms, or saunas.  Do not douche or use tampons or scented sanitary pads.  Do not cross your legs for long periods of time.  Avoid cat litter boxes and soil used by cats. These carry germs that can cause birth defects in the baby and possibly loss of the fetus by miscarriage or stillbirth.  Avoid all smoking, herbs, alcohol, and medicines not prescribed by your health care provider. Chemicals in these products affect the formation and growth of the baby.  Do not use any products that contain nicotine or tobacco, such as cigarettes and e-cigarettes. If you need help quitting, ask your health care provider. You may receive counseling support and other resources to help you quit.  Schedule a dentist appointment. At home, brush your teeth with a soft toothbrush and be gentle when you floss. Contact a health care provider if:  You have dizziness.  You have mild pelvic cramps, pelvic pressure, or nagging pain in the abdominal area.  You have persistent nausea, vomiting, or diarrhea.  You have a bad smelling vaginal discharge.  You have pain when you urinate.  You notice increased swelling in your face, hands, legs, or ankles.  You are exposed to fifth disease or chickenpox.  You are exposed to German measles (rubella) and have never had it. Get help right away if:  You have a fever.  You are leaking fluid from your vagina.  You have spotting or bleeding from your vagina.  You have severe abdominal cramping or pain.  You have rapid weight gain or loss.  You vomit blood or material that looks like coffee grounds.  You develop a severe headache.  You have shortness of breath.  You have any kind of trauma, such as from a fall or a car accident. Summary  The first trimester of pregnancy is from week 1 until the end of week 13 (months 1 through 3).  Your body goes through many changes during pregnancy. The changes vary from  woman to woman.  You will have routine prenatal visits. During those visits, your health care provider will examine you, discuss any test results you may have, and talk with you about how you are feeling. This information is not intended to replace advice given to you by your health care provider. Make sure you discuss any questions you have with your health care provider. Document Released: 01/05/2001 Document Revised: 12/24/2015 Document Reviewed: 12/24/2015 Elsevier Interactive Patient Education  2018 [employer]   [employer]  

## 2017-03-15 DIAGNOSIS — F1290 Cannabis use, unspecified, uncomplicated: Secondary | ICD-10-CM | POA: Insufficient documentation

## 2017-03-15 LAB — DRUG SCREEN, URINE
Amphetamines, Urine: NEGATIVE ng/mL
Barbiturate screen, urine: NEGATIVE ng/mL
Benzodiazepine Quant, Ur: NEGATIVE ng/mL
Cannabinoid Quant, Ur: POSITIVE ng/mL — AB
Cocaine (Metab.): NEGATIVE ng/mL
OPIATE QUANT UR: NEGATIVE ng/mL
PCP QUANT UR: NEGATIVE ng/mL

## 2017-03-15 LAB — GC/CHLAMYDIA PROBE AMP (~~LOC~~) NOT AT ARMC
CHLAMYDIA, DNA PROBE: NEGATIVE
NEISSERIA GONORRHEA: NEGATIVE

## 2017-03-15 NOTE — Addendum Note (Signed)
Addended by: Lorelle GibbsWILSON, CHIQUITA L on: 03/15/2017 11:58 AM   Modules accepted: Orders

## 2017-03-21 LAB — CULTURE, OB URINE

## 2017-03-21 LAB — URINE CULTURE, OB REFLEX

## 2017-03-23 ENCOUNTER — Other Ambulatory Visit: Payer: Self-pay | Admitting: Obstetrics & Gynecology

## 2017-03-23 DIAGNOSIS — O2342 Unspecified infection of urinary tract in pregnancy, second trimester: Secondary | ICD-10-CM

## 2017-03-23 LAB — OBSTETRIC PANEL, INCLUDING HIV
Antibody Screen: NEGATIVE
BASOS ABS: 0 10*3/uL (ref 0.0–0.2)
Basos: 0 %
EOS (ABSOLUTE): 0.1 10*3/uL (ref 0.0–0.4)
Eos: 1 %
HIV SCREEN 4TH GENERATION: NONREACTIVE
Hematocrit: 33.2 % — ABNORMAL LOW (ref 34.0–46.6)
Hemoglobin: 11 g/dL — ABNORMAL LOW (ref 11.1–15.9)
Hepatitis B Surface Ag: NEGATIVE
IMMATURE GRANULOCYTES: 0 %
Immature Grans (Abs): 0 10*3/uL (ref 0.0–0.1)
Lymphocytes Absolute: 1.3 10*3/uL (ref 0.7–3.1)
Lymphs: 14 %
MCH: 31.7 pg (ref 26.6–33.0)
MCHC: 33.1 g/dL (ref 31.5–35.7)
MCV: 96 fL (ref 79–97)
MONOCYTES: 5 %
Monocytes Absolute: 0.4 10*3/uL (ref 0.1–0.9)
NEUTROS PCT: 80 %
Neutrophils Absolute: 7.3 10*3/uL — ABNORMAL HIGH (ref 1.4–7.0)
PLATELETS: 234 10*3/uL (ref 150–379)
RBC: 3.47 x10E6/uL — ABNORMAL LOW (ref 3.77–5.28)
RDW: 12.8 % (ref 12.3–15.4)
RPR: NONREACTIVE
Rh Factor: POSITIVE
Rubella Antibodies, IGG: 5.62 index (ref >0.99)
WBC: 9.2 10*3/uL (ref 3.4–10.8)

## 2017-03-23 LAB — SMN1 COPY NUMBER ANALYSIS (SMA CARRIER SCREENING)

## 2017-03-23 MED ORDER — AMPICILLIN 500 MG PO CAPS: 500.0000 mg | ORAL_CAPSULE | Freq: Three times a day (TID) | ORAL | 0 refills | Status: DC

## 2017-03-25 ENCOUNTER — Telehealth: Payer: Self-pay | Admitting: General Practice

## 2017-03-25 DIAGNOSIS — Z3400 Encounter for supervision of normal first pregnancy, unspecified trimester: Secondary | ICD-10-CM

## 2017-03-25 MED ORDER — PRENATAL GUMMIES/DHA & FA 0.4-32.5 MG PO CHEW
1.0000 | CHEWABLE_TABLET | Freq: Every day | ORAL | 11 refills | Status: DC
Start: 2017-03-25 — End: 2017-08-17

## 2017-03-25 NOTE — Telephone Encounter (Signed)
Patient called into front office stating she got her test results but she wants to know what she is having. Told patient that test wasn't done at her prenatal visit. Patient verbalized understanding & states she wants an ultrasound to make sure everything is okay with the baby because she has been having cramps. Told patient an ultrasound isn't indicated at this time, but if she is having severe pain or cramps that come every 5 minutes for over an hour, she should come in to be seen in MAU. Discussed that some cramping in pregnancy is normal. Patient denies severe pain or contractions. Patient states so I'm going to have an ultrasound when I come in on the 11th. Told patient no we will schedule her one around 18-19 weeks. Patient states she doesn't want to wait that long for an ultrasound and she has never heard of having to wait that long. Told patient there isn't a reason for us to do an ultrasound prior to then but if she wants to come in for blood work for genetic testing prior to her next appt she can. Patient states that's what I want to do.  Patient also requests Rx for gummy PNV. Told patient we will send that in for her. Phone call transferred to front to set up lab visit. Patient had no other questions.

## 2017-03-28 ENCOUNTER — Other Ambulatory Visit: Payer: BLUE CROSS/BLUE SHIELD

## 2017-03-28 ENCOUNTER — Other Ambulatory Visit: Payer: Self-pay | Admitting: *Deleted

## 2017-03-28 DIAGNOSIS — Z3400 Encounter for supervision of normal first pregnancy, unspecified trimester: Secondary | ICD-10-CM

## 2017-03-29 ENCOUNTER — Encounter: Payer: Self-pay | Admitting: Obstetrics and Gynecology

## 2017-04-01 NOTE — BH Specialist Note (Deleted)
Integrated Behavioral Health Initial Visit  MRN: [id number] Name: Lendell Capriceymia M Musil  Number of Integrated Behavioral Health Clinician visits:: 1/6 Session Start time: ***  Session End time: *** Total time: {IBH Total Time:21014050}  Type of Service: Integrated Behavioral Health- Individual/Family Interpretor:No. Interpretor Name and Language: n/a   Warm Hand Off Completed.       SUBJECTIVE: Lendell Capriceymia M Petree is a 22 y.o. female accompanied by {CHL AMB ACCOMPANIED WU:[id number]}[phone]} Patient was referred by Dr Debroah LoopArnold for marijuana use. Patient reports the following symptoms/concerns: *** Duration of problem: ***; Severity of problem: {Mild/Moderate/Severe:20260}  OBJECTIVE: Mood: {BHH MOOD:22306} and Affect: {BHH AFFECT:22307} Risk of harm to self or others: No plan to harm self or others ***  LIFE CONTEXT: Family and Social: *** School/Work: *** Self-Care: Marijuana *** Life Changes: Current pregnancy ***  GOALS ADDRESSED: Patient will: 1. Reduce symptoms of: {IBH Symptoms:21014056} 2. Increase knowledge and/or ability of: {IBH Patient Tools:21014057}  3. Demonstrate ability to: {IBH Goals:21014053}  INTERVENTIONS: Interventions utilized: {IBH Interventions:21014054}  Standardized Assessments completed: GAD-7 and PHQ 9 ***  ASSESSMENT: Patient currently experiencing ***.   Patient may benefit from psychoeducation and brief therapeutic interventions regarding coping with symptoms of *** .  PLAN: 1. Follow up with behavioral health clinician on : *** 2. Behavioral recommendations:  -*** -*** -*** 3. Referral(s): {IBH Referrals:21014055} 4. "From scale of 1-10, how likely are you to follow plan?": ***  Valetta CloseJamie C Yarielis Funaro, LCSW

## 2017-04-04 ENCOUNTER — Encounter: Payer: BLUE CROSS/BLUE SHIELD | Admitting: Obstetrics & Gynecology

## 2017-04-07 ENCOUNTER — Encounter: Payer: BLUE CROSS/BLUE SHIELD | Admitting: Student

## 2017-05-12 ENCOUNTER — Ambulatory Visit (INDEPENDENT_AMBULATORY_CARE_PROVIDER_SITE_OTHER): Payer: Medicaid Other | Admitting: Student

## 2017-05-12 ENCOUNTER — Encounter: Payer: Self-pay | Admitting: Student

## 2017-05-12 VITALS — BP 117/62 | HR 85 | Wt 153.3 lb

## 2017-05-12 DIAGNOSIS — Z3400 Encounter for supervision of normal first pregnancy, unspecified trimester: Secondary | ICD-10-CM

## 2017-05-12 DIAGNOSIS — O2342 Unspecified infection of urinary tract in pregnancy, second trimester: Secondary | ICD-10-CM | POA: Diagnosis not present

## 2017-05-12 DIAGNOSIS — Z3402 Encounter for supervision of normal first pregnancy, second trimester: Secondary | ICD-10-CM

## 2017-05-12 DIAGNOSIS — F1290 Cannabis use, unspecified, uncomplicated: Secondary | ICD-10-CM

## 2017-05-12 NOTE — Progress Notes (Deleted)
   PRENATAL VISIT NOTE  Subjective:  Evelyn Mcclain is a [age] y.o. G1P0000 at 8215w1d being seen today for ongoing prenatal care.  She is currently monitored for the following issues for this low-risk pregnancy and has Polysubstance abuse (HCC); Supervision of normal first pregnancy, antepartum; and Marijuana use on their problem list.  Patient reports cramping, pulling, sharp pains in the morning. .  Contractions: Not present. Vag. Bleeding: None.  Movement: Present. Denies leaking of fluid.   The following portions of the patient's history were reviewed and updated as appropriate: allergies, current medications, past family history, past medical history, past social history, past surgical history and problem list. Problem list updated.  Objective:   Vitals:   05/12/17 1622  BP: 117/62  Pulse: 85  Weight: 153 lb 4.8 oz (69.5 kg)    Fetal Status: Fetal Heart Rate (bpm): 160   Movement: Present     General:  Alert, oriented and cooperative. Patient is in no acute distress.  Skin: Skin is warm and dry. No rash noted.   Cardiovascular: Normal heart rate noted  Respiratory: Normal respiratory effort, no problems with respiration noted  Abdomen: Soft, gravid, appropriate for gestational age.  Pain/Pressure: Present     Pelvic: {Blank single:19197::"Cervical exam performed","Cervical exam deferred"}        Extremities: Normal range of motion.  Edema: None  Mental Status: Normal mood and affect. Normal behavior. Normal judgment and thought content.   Assessment and Plan:  Pregnancy: G1P0000 at 6515w1d  1. Supervision of normal first pregnancy, antepartum *** - US MFM OB COMP + 14 WK; Future  2. Marijuana use ***  {Blank single:19197::"Term","Preterm"} labor symptoms and general obstetric precautions including but not limited to vaginal bleeding, contractions, leaking of fluid and fetal movement were reviewed in detail with the patient. Please refer to After Visit Summary for other  counseling recommendations.  No follow-ups on file.  Future Appointments  Date Time Provider Department Center  05/24/2017  1:45 PM [facility] US 2 [facility]    Evelyn Mcclain, CNM

## 2017-05-12 NOTE — Progress Notes (Signed)
   PRENATAL VISIT NOTE  Subjective:  Evelyn Mcclain is a 22 y.o. G1P0000 at 4465w1d being seen today for ongoing prenatal care.  She is currently monitored for the following issues for this low-risk pregnancy and has Polysubstance abuse (HCC); Supervision of normal first pregnancy, antepartum; and Marijuana use on their problem list.  Patient reports "feelings" in the morning and evening. She describes them as movement and pain. She denies bleeding, LOF , dysuria. Says that she took her medicine for her UTI. She is very upset because she thought she was getting her US today. Is insisting on an US. Refused drug screen. .  Contractions: Not present. Vag. Bleeding: None.  Movement: Present. Denies leaking of fluid.   The following portions of the patient's history were reviewed and updated as appropriate: allergies, current medications, past family history, past medical history, past social history, past surgical history and problem list. Problem list updated.  Objective:   Vitals:   05/12/17 1622  BP: 117/62  Pulse: 85  Weight: 153 lb 4.8 oz (69.5 kg)    Fetal Status: Fetal Heart Rate (bpm): 160 Fundal Height: 21 cm Movement: Present     General:  Alert, oriented and cooperative. Patient is in no acute distress.  Skin: Skin is warm and dry. No rash noted.   Cardiovascular: Normal heart rate noted  Respiratory: Normal respiratory effort, no problems with respiration noted  Abdomen: Soft, gravid, appropriate for gestational age.  Pain/Pressure: Present     Pelvic: Cervical exam deferred        Extremities: Normal range of motion.  Edema: None  Mental Status: Normal mood and affect. Normal behavior. Normal judgment and thought content.   Assessment and Plan:  Pregnancy: G1P0000 at 9665w1d  1. Supervision of normal first pregnancy, antepartum Explained to patient that we do not have a medical reason for US today; it is not an emergency. Patient is unhappy bc her US is scheduled for April 30;  I recommended that she continue to call and see if she can get an earlier morning appointment. Encouraged patient to come to MAU if she has any bleeding, LOF, or other ob-gyn complaints.   Review of chart shows that patient missed three appointments in March.   - US MFM OB COMP + 14 WK; Future - Culture, OB Urine  2. Marijuana use -Refused UDS today, says that it will still be positive for THC. Says that she will do drug test in 4 weeks.   3. Will do TOC for UTI today  Preterm labor symptoms and general obstetric precautions including but not limited to vaginal bleeding, contractions, leaking of fluid and fetal movement were reviewed in detail with the patient. Please refer to After Visit Summary for other counseling recommendations.  Return in about 1 month (around 06/09/2017), or needs morning appt, for LROB.  Future Appointments  Date Time Provider Department Center  05/24/2017  1:45 PM [facility] US 2 [facility]  06/16/2017  8:55 AM Crisoforo OxfordKooistra, Charlesetta GaribaldiKathryn Lorraine, CNM [facility]    Charlesetta GaribaldiKathryn Lorraine [place], [place]

## 2017-05-12 NOTE — Patient Instructions (Signed)

## 2017-05-14 LAB — CULTURE, OB URINE

## 2017-05-14 LAB — URINE CULTURE, OB REFLEX

## 2017-05-24 ENCOUNTER — Other Ambulatory Visit: Payer: Self-pay | Admitting: Student

## 2017-05-24 ENCOUNTER — Ambulatory Visit (HOSPITAL_COMMUNITY)
Admission: RE | Admit: 2017-05-24 | Discharge: 2017-05-24 | Disposition: A | Discharge status: Home / Self Care | Payer: Medicaid Other | Source: Ambulatory Visit | Attending: Student | Admitting: Student

## 2017-05-24 DIAGNOSIS — Z363 Encounter for antenatal screening for malformations: Secondary | ICD-10-CM | POA: Insufficient documentation

## 2017-05-24 DIAGNOSIS — Z3A22 22 weeks gestation of pregnancy: Secondary | ICD-10-CM | POA: Diagnosis not present

## 2017-05-24 DIAGNOSIS — Z3400 Encounter for supervision of normal first pregnancy, unspecified trimester: Secondary | ICD-10-CM

## 2017-06-16 ENCOUNTER — Encounter: Payer: Self-pay | Admitting: Student

## 2017-07-07 ENCOUNTER — Encounter: Payer: Self-pay | Admitting: Student

## 2017-08-08 ENCOUNTER — Inpatient Hospital Stay (HOSPITAL_COMMUNITY)
Admission: AD | Admit: 2017-08-08 | Discharge: 2017-08-17 | DRG: 807 | Disposition: A | Discharge status: Home / Self Care | Payer: Medicaid Other | Attending: Obstetrics & Gynecology | Admitting: Obstetrics & Gynecology

## 2017-08-08 ENCOUNTER — Ambulatory Visit (INDEPENDENT_AMBULATORY_CARE_PROVIDER_SITE_OTHER): Payer: Medicaid Other | Admitting: Advanced Practice Midwife

## 2017-08-08 ENCOUNTER — Encounter: Payer: Self-pay | Admitting: Advanced Practice Midwife

## 2017-08-08 ENCOUNTER — Other Ambulatory Visit: Payer: Self-pay

## 2017-08-08 ENCOUNTER — Encounter (HOSPITAL_COMMUNITY): Payer: Self-pay | Admitting: *Deleted

## 2017-08-08 VITALS — BP 200/108 | HR 71 | Wt 172.1 lb

## 2017-08-08 DIAGNOSIS — F1290 Cannabis use, unspecified, uncomplicated: Secondary | ICD-10-CM | POA: Diagnosis present

## 2017-08-08 DIAGNOSIS — O99334 Smoking (tobacco) complicating childbirth: Secondary | ICD-10-CM | POA: Diagnosis present

## 2017-08-08 DIAGNOSIS — Z3400 Encounter for supervision of normal first pregnancy, unspecified trimester: Secondary | ICD-10-CM

## 2017-08-08 DIAGNOSIS — Z3403 Encounter for supervision of normal first pregnancy, third trimester: Secondary | ICD-10-CM | POA: Diagnosis not present

## 2017-08-08 DIAGNOSIS — Z0489 Encounter for examination and observation for other specified reasons: Secondary | ICD-10-CM

## 2017-08-08 DIAGNOSIS — O99324 Drug use complicating childbirth: Secondary | ICD-10-CM | POA: Diagnosis present

## 2017-08-08 DIAGNOSIS — Z3A33 33 weeks gestation of pregnancy: Secondary | ICD-10-CM

## 2017-08-08 DIAGNOSIS — Z8759 Personal history of other complications of pregnancy, childbirth and the puerperium: Secondary | ICD-10-CM | POA: Diagnosis present

## 2017-08-08 DIAGNOSIS — O1493 Unspecified pre-eclampsia, third trimester: Secondary | ICD-10-CM | POA: Diagnosis not present

## 2017-08-08 DIAGNOSIS — O1424 HELLP syndrome, complicating childbirth: Secondary | ICD-10-CM | POA: Diagnosis not present

## 2017-08-08 DIAGNOSIS — O365930 Maternal care for other known or suspected poor fetal growth, third trimester, not applicable or unspecified: Secondary | ICD-10-CM | POA: Diagnosis present

## 2017-08-08 DIAGNOSIS — O1414 Severe pre-eclampsia complicating childbirth: Secondary | ICD-10-CM | POA: Diagnosis present

## 2017-08-08 DIAGNOSIS — O09299 Supervision of pregnancy with other poor reproductive or obstetric history, unspecified trimester: Secondary | ICD-10-CM | POA: Diagnosis not present

## 2017-08-08 DIAGNOSIS — Z362 Encounter for other antenatal screening follow-up: Secondary | ICD-10-CM

## 2017-08-08 DIAGNOSIS — O1425 HELLP syndrome, complicating the puerperium: Secondary | ICD-10-CM | POA: Diagnosis not present

## 2017-08-08 DIAGNOSIS — O365990 Maternal care for other known or suspected poor fetal growth, unspecified trimester, not applicable or unspecified: Secondary | ICD-10-CM

## 2017-08-08 DIAGNOSIS — F17210 Nicotine dependence, cigarettes, uncomplicated: Secondary | ICD-10-CM | POA: Diagnosis present

## 2017-08-08 DIAGNOSIS — O1490 Unspecified pre-eclampsia, unspecified trimester: Secondary | ICD-10-CM | POA: Diagnosis present

## 2017-08-08 DIAGNOSIS — O1494 Unspecified pre-eclampsia, complicating childbirth: Secondary | ICD-10-CM | POA: Diagnosis present

## 2017-08-08 DIAGNOSIS — IMO0002 Reserved for concepts with insufficient information to code with codable children: Secondary | ICD-10-CM

## 2017-08-08 HISTORY — DX: Unspecified infectious disease: B99.9

## 2017-08-08 HISTORY — DX: Anemia, unspecified: D64.9

## 2017-08-08 HISTORY — DX: Gestational (pregnancy-induced) hypertension without significant proteinuria, unspecified trimester: O13.9

## 2017-08-08 LAB — COMPREHENSIVE METABOLIC PANEL
ALT: 13 U/L (ref 0–44)
ANION GAP: 8 (ref 5–15)
AST: 23 U/L (ref 15–41)
Albumin: 2.8 g/dL — ABNORMAL LOW (ref 3.5–5.0)
Alkaline Phosphatase: 116 U/L (ref 38–126)
BILIRUBIN TOTAL: 0.3 mg/dL (ref 0.3–1.2)
BUN: 10 mg/dL (ref 6–20)
CO2: 20 mmol/L — ABNORMAL LOW (ref 22–32)
Calcium: 8.6 mg/dL — ABNORMAL LOW (ref 8.9–10.3)
Chloride: 106 mmol/L (ref 98–111)
Creatinine, Ser: 0.39 mg/dL — ABNORMAL LOW (ref 0.44–1.00)
Glucose, Bld: 80 mg/dL (ref 70–99)
Potassium: 3.7 mmol/L (ref 3.5–5.1)
SODIUM: 134 mmol/L — AB (ref 135–145)
TOTAL PROTEIN: 6.1 g/dL — AB (ref 6.5–8.1)

## 2017-08-08 LAB — CBC
HEMATOCRIT: 29.2 % — AB (ref 36.0–46.0)
HEMOGLOBIN: 9.8 g/dL — AB (ref 12.0–15.0)
MCH: 31.3 pg (ref 26.0–34.0)
MCHC: 33.6 g/dL (ref 30.0–36.0)
MCV: 93.3 fL (ref 78.0–100.0)
Platelets: 188 10*3/uL (ref 150–400)
RBC: 3.13 MIL/uL — ABNORMAL LOW (ref 3.87–5.11)
RDW: 12.8 % (ref 11.5–15.5)
WBC: 6.7 10*3/uL (ref 4.0–10.5)

## 2017-08-08 LAB — TYPE AND SCREEN
ABO/RH(D): B POS
ANTIBODY SCREEN: NEGATIVE

## 2017-08-08 LAB — PROTEIN / CREATININE RATIO, URINE
CREATININE, URINE: 59 mg/dL
PROTEIN CREATININE RATIO: 1.46 mg/mg{creat} — AB (ref 0.00–0.15)
TOTAL PROTEIN, URINE: 86 mg/dL

## 2017-08-08 LAB — ABO/RH: ABO/RH(D): B POS

## 2017-08-08 MED ORDER — LABETALOL HCL 200 MG PO TABS: 300.0000 mg | ORAL_TABLET | Freq: Three times a day (TID) | ORAL | Status: DC

## 2017-08-08 MED ORDER — MAGNESIUM SULFATE BOLUS VIA INFUSION
6.0000 g | Freq: Once | INTRAVENOUS | Status: AC
  Administered 2017-08-08: 6 g via INTRAVENOUS
  Filled 2017-08-08: qty 500

## 2017-08-08 MED ORDER — ACETAMINOPHEN 325 MG PO TABS
650.0000 mg | ORAL_TABLET | ORAL | Status: DC | PRN
  Administered 2017-08-09: 650 mg via ORAL
  Filled 2017-08-08: qty 2

## 2017-08-08 MED ORDER — HYDRALAZINE HCL 20 MG/ML IJ SOLN: 10.0000 mg | Freq: Once | INTRAMUSCULAR | Status: DC | PRN

## 2017-08-08 MED ORDER — BETAMETHASONE SOD PHOS & ACET 6 (3-3) MG/ML IJ SUSP
12.0000 mg | INTRAMUSCULAR | Status: AC
  Administered 2017-08-08 – 2017-08-09 (×2): 12 mg via INTRAMUSCULAR
  Filled 2017-08-08 (×2): qty 2

## 2017-08-08 MED ORDER — HYDRALAZINE HCL 20 MG/ML IJ SOLN
10.0000 mg | Freq: Once | INTRAMUSCULAR | Status: DC | PRN
  Filled 2017-08-08: qty 1

## 2017-08-08 MED ORDER — DOCUSATE SODIUM 100 MG PO CAPS
100.0000 mg | ORAL_CAPSULE | Freq: Every day | ORAL | Status: DC
  Filled 2017-08-08: qty 1

## 2017-08-08 MED ORDER — CALCIUM CARBONATE ANTACID 500 MG PO CHEW: 2.0000 | CHEWABLE_TABLET | ORAL | Status: DC | PRN

## 2017-08-08 MED ORDER — MAGNESIUM SULFATE 40 G IN LACTATED RINGERS - SIMPLE
2.0000 g/h | INTRAVENOUS | Status: DC
  Administered 2017-08-08 – 2017-08-11 (×4): 2 g/h via INTRAVENOUS
  Filled 2017-08-08 (×3): qty 40
  Filled 2017-08-08 (×2): qty 500

## 2017-08-08 MED ORDER — LABETALOL HCL 200 MG PO TABS
300.0000 mg | ORAL_TABLET | Freq: Three times a day (TID) | ORAL | Status: DC
  Administered 2017-08-08: 300 mg via ORAL

## 2017-08-08 MED ORDER — LABETALOL HCL 5 MG/ML IV SOLN
20.0000 mg | INTRAVENOUS | Status: AC | PRN
  Administered 2017-08-08: 80 mg via INTRAVENOUS
  Administered 2017-08-08: 20 mg via INTRAVENOUS
  Administered 2017-08-08: 40 mg via INTRAVENOUS
  Filled 2017-08-08: qty 8
  Filled 2017-08-08: qty 4
  Filled 2017-08-08: qty 16

## 2017-08-08 MED ORDER — LACTATED RINGERS IV SOLN
INTRAVENOUS | Status: DC
Start: 2017-08-08 — End: 2017-08-08
  Administered 2017-08-08: 18:00:00 via INTRAVENOUS

## 2017-08-08 MED ORDER — HYDRALAZINE HCL 20 MG/ML IJ SOLN
5.0000 mg | INTRAMUSCULAR | Status: AC | PRN
  Administered 2017-08-11 – 2017-08-12 (×2): 5 mg via INTRAVENOUS
  Filled 2017-08-08 (×2): qty 1

## 2017-08-08 MED ORDER — PRENATAL MULTIVITAMIN CH
1.0000 | ORAL_TABLET | Freq: Every day | ORAL | Status: DC
  Administered 2017-08-09: 1 via ORAL
  Filled 2017-08-08: qty 1

## 2017-08-08 MED ORDER — ZOLPIDEM TARTRATE 5 MG PO TABS: 5.0000 mg | ORAL_TABLET | Freq: Every evening | ORAL | Status: DC | PRN

## 2017-08-08 MED ORDER — LACTATED RINGERS IV SOLN
INTRAVENOUS | Status: DC
  Administered 2017-08-09: 12:00:00 via INTRAVENOUS

## 2017-08-08 NOTE — H&P (Addendum)
Evelyn Mcclain is a 22 y.o. female G1 @33 .5 weeks presenting for preeclampsia. Denies HA, visual disturbances, RUQ pain, SOB, and CP. Reports pedal edema x1 week. Good FM. Her pregnancy has been complicated by limited [facility] and marijuana use, was last seen in April.   OB History    Gravida  1   Para      Term      Preterm      AB  0   Living        SAB  0   TAB      Ectopic      Multiple      Live Births             Past Medical History:  Diagnosis Date  . Anemia   . Chlamydia   . Heart murmur   . Hx of seasonal allergies   . Infection    uti  . Pregnancy induced hypertension   . Subarachnoid hemorrhage following injury (HCC) 08/30/2011  . 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 NO PAST SURGERIES     Family History: family history includes Asthma in her brother and maternal grandmother; Diabetes in her paternal grandfather and paternal grandmother; Hypertension in her maternal aunt, maternal grandfather, maternal grandmother, maternal uncle, and mother. Social History:  reports that she has been smoking cigarettes.  She has been smoking about 0.00 packs per day. She has never used smokeless tobacco. She reports that she has current or past drug history. Drug: Marijuana. She reports that she does not drink alcohol.  Review of Systems  Eyes: Negative for blurred vision, double vision and photophobia.  Cardiovascular: Positive for leg swelling.  Gastrointestinal: Negative for abdominal pain.  Neurological: Negative for headaches.   Maternal Medical History:  Fetal activity: Perceived fetal activity is normal.   Last perceived fetal movement was within the past hour.    Prenatal complications: Pre-eclampsia.       Blood pressure (!) 203/124, pulse 89, temperature 98 F (36.7 C), temperature source Oral, resp. rate 20, last menstrual period 12/15/2016. Maternal Exam:  Uterine Assessment:  Contraction frequency is rare.   Abdomen: Fetal presentation: vertex     Fetal Exam Fetal Monitor Review: Mode: ultrasound.   Baseline rate: 145.  Variability: moderate (6-25 bpm).   Pattern: accelerations present and no decelerations.    Fetal State Assessment: Category I - tracings are normal.     Physical Exam  Constitutional: She is oriented to person, place, and time. She appears well-developed and well-nourished. No distress.  HENT:  Head: Normocephalic and atraumatic.  Neck: Normal range of motion.  Cardiovascular: Normal rate, regular rhythm and normal heart sounds.  Respiratory: Effort normal and breath sounds normal. No respiratory distress. She has no wheezes. She has no rales.  GI: Soft. She exhibits no distension. There is no tenderness.  gravid  Musculoskeletal: Normal range of motion. She exhibits edema (3+ pedal).  Neurological: She is alert and oriented to person, place, and time. She has normal reflexes.  Skin: Skin is warm and dry.  Psychiatric: She has a normal mood and affect.    Prenatal labs: ABO, Rh: B/Positive/-- (02/18 1610) Antibody: Negative (02/18 1610) Rubella: 5.62 (02/18 1610) RPR: Non Reactive (02/18 1610)  HBsAg: Negative (02/18 1610)  HIV: Non Reactive (02/18 1610)  GBS:  unknown  Assessment/Plan: 33.[redacted] weeks gestation Preeclampsia Admit to Ante Labetalol, Hydralazine prn BMZ MgSO4 NICU consult MFM [place] in  am   Donette LarryMelanie Bhambri, [place] 08/08/2017, 6:34 PM   Patient is 22 yo G1P0 @ 8424w5d with no prenatal care since 04/2017, otherwise normal pregnancy, who presents with severe range BP. She is without complaints today. S/p multiple IV pushes in MAU and BP improved. MgSO4 bolus started, now 2 g/hr. UPCr elevated but labs otherwise normal. Cephalic by bedside US. She is admitted for pre-eclampsia with severe features, reviewed expected course and management. For BTMZ (received first dose in MAU) x2. For NICU consult and MFM scan tomorrow.  Will plan for induction at 34 weeks unless unable to control HTN or any s/s worsening pre-eclampsia. Will start oral anti-hypertensive as needed. Cont EFM for now. Reviewed all of the above with the patient and her family, they verbalize understanding of and are in agreement with plan.    Baldemar LenisK. Meryl Avea Mcgowen, M.D. [facility] 08/08/17 8:29 PM

## 2017-08-08 NOTE — Progress Notes (Signed)
   PRENATAL VISIT NOTE  Subjective:  Evelyn Mcclain is a 22 y.o. G1P0000 at 8158w5d being seen today for ongoing prenatal care.  She is currently monitored for the following issues for this low-risk pregnancy and has Polysubstance abuse (HCC); Supervision of normal first pregnancy, antepartum; Marijuana use; and UTI (urinary tract infection) during pregnancy, second trimester on their problem list.  Patient reports edema .  Contractions: Not present. Vag. Bleeding: None.  Movement: Present. Denies leaking of fluid.   The following portions of the patient's history were reviewed and updated as appropriate: allergies, current medications, past family history, past medical history, past social history, past surgical history and problem list. Problem list updated.  Objective:   Vitals:   08/08/17 1623 08/08/17 1628  BP: (!) 211/97 (!) 200/108  Pulse: 64 71  Weight: 172 lb 1.6 oz (78.1 kg)     Fetal Status:     Movement: Present     General:  Alert, oriented and cooperative. Patient is in no acute distress.  Skin: Skin is warm and dry. No rash noted.   Cardiovascular: Normal heart rate noted  Respiratory: Normal respiratory effort, no problems with respiration noted  Abdomen: Soft, gravid, appropriate for gestational age.  Pain/Pressure: Present     Pelvic: Cervical exam deferred        Extremities: Normal range of motion.  Edema: Deep pitting, indentation remains for a short time  Mental Status: Normal mood and affect. Normal behavior. Normal judgment and thought content.   Assessment and Plan:  Pregnancy: G1P0000 at 2858w5d  1. Supervision of normal first pregnancy, antepartum  Hypertension, severe range To MAU now for b/p management   Preterm labor symptoms and general obstetric precautions including but not limited to vaginal bleeding, contractions, leaking of fluid and fetal movement were reviewed in detail with the patient. Please refer to After Visit Summary for other counseling  recommendations.  Return in about 2 weeks (around 08/22/2017).  No future appointments.  Thressa ShellerHeather Kaelin Bonelli, CNM

## 2017-08-08 NOTE — Progress Notes (Signed)
Denies headache, states edema gets worse . C/o hard time walking, standing due to edema in her feet. Elevated gad7 , patient teary when told she may be admitted due to bp. Asked Asher MuirJamie, [facility] to [facility].   Report called to charge nurse Sunny SchleinFelicia and [facility]

## 2017-08-08 NOTE — MAU Note (Signed)
Sent up from clinic for pre-e eval. BP elevated.  Denies HA< visual changes, epigastric pain, has noted an increase in swelling in her feet.

## 2017-08-08 NOTE — Patient Instructions (Signed)

## 2017-08-09 ENCOUNTER — Inpatient Hospital Stay (HOSPITAL_COMMUNITY): Payer: Medicaid Other

## 2017-08-09 MED ORDER — ONDANSETRON HCL 4 MG PO TABS
4.0000 mg | ORAL_TABLET | Freq: Four times a day (QID) | ORAL | Status: DC | PRN
  Administered 2017-08-09 (×2): 4 mg via ORAL
  Filled 2017-08-09 (×2): qty 1

## 2017-08-09 MED ORDER — LABETALOL HCL 200 MG PO TABS
400.0000 mg | ORAL_TABLET | Freq: Three times a day (TID) | ORAL | Status: DC
  Administered 2017-08-09 – 2017-08-13 (×13): 400 mg via ORAL
  Filled 2017-08-09 (×7): qty 2
  Filled 2017-08-09: qty 4
  Filled 2017-08-09 (×6): qty 2

## 2017-08-09 NOTE — Progress Notes (Signed)
Patient ID: Evelyn Mcclain, female   DOB: 10/27/1995, 22 y.o.   MRN: [id number] FACULTY PRACTICE ANTEPARTUM(COMPREHENSIVE) NOTE  Evelyn Mcclain is a 22 y.o. G1P0000 at 172w6d by best clinical estimate who is admitted for severe preeclampsia.   Fetal presentation is cephalic. Length of Stay:  1  Days  Subjective: Mild headache, nausea Patient reports the fetal movement as active. Patient reports uterine contraction  activity as none. Patient reports  vaginal bleeding as none. Patient describes fluid per vagina as None.  Vitals:  Blood pressure (!) 150/95, pulse 86, temperature 98 F (36.7 C), temperature source Oral, resp. rate 16, last menstrual period 12/15/2016, SpO2 99 %. Physical Examination:  General appearance - alert, well appearing, and in no distress Heart - normal rate and regular rhythm Abdomen - soft, nontender, nondistended Fundal Height:  size equals dates. Extremities: extremities normal, atraumatic, no cyanosis or edema and Homans sign is negative, no sign of DVT  Membranes:intact  Fetal Monitoring:     Fetal Heart Rate A  Mode External filed at 08/09/2017 0700  Baseline Rate (A) 130 bpm filed at 08/09/2017 0700  Variability 6-25 BPM filed at 08/09/2017 0700  Accelerations 15 x 15 filed at 08/09/2017 0700  Decelerations None filed at 08/09/2017 0700     Labs:  Results for orders placed or performed during the hospital encounter of 08/08/17 (from the past 24 hour(s))  Protein / creatinine ratio, urine   Collection Time: 08/08/17  5:25 PM  Result Value Ref Range   Creatinine, Urine 59.00 mg/dL   Total Protein, Urine 86 mg/dL   Protein Creatinine Ratio 1.46 (H) 0.00 - 0.15 mg/mg[Cre]  ABO/Rh   Collection Time: 08/08/17  5:50 PM  Result Value Ref Range   ABO/RH(D)      B POS Performed at [facility], [address] [place], [place] [address]   CBC   Collection Time: 08/08/17  5:57 PM  Result Value Ref Range   WBC 6.7 4.0 - 10.5 K/uL   RBC 3.13 (L)  3.87 - 5.11 MIL/uL   Hemoglobin 9.8 (L) 12.0 - 15.0 g/dL   HCT 96.229.2 (L) 95.236.0 - 84.146.0 %   MCV 93.3 78.0 - 100.0 fL   MCH 31.3 26.0 - 34.0 pg   MCHC 33.6 30.0 - 36.0 g/dL   RDW 32.412.8 40.111.5 - 02.715.5 %   Platelets 188 150 - 400 K/uL  Comprehensive metabolic panel   Collection Time: 08/08/17  5:57 PM  Result Value Ref Range   Sodium 134 (L) 135 - 145 mmol/L   Potassium 3.7 3.5 - 5.1 mmol/L   Chloride 106 98 - 111 mmol/L   CO2 20 (L) 22 - 32 mmol/L   Glucose, Bld 80 70 - 99 mg/dL   BUN 10 6 - 20 mg/dL   Creatinine, Ser 2.530.39 (L) 0.44 - 1.00 mg/dL   Calcium 8.6 (L) 8.9 - 10.3 mg/dL   Total Protein 6.1 (L) 6.5 - 8.1 g/dL   Albumin 2.8 (L) 3.5 - 5.0 g/dL   AST 23 15 - 41 U/L   ALT 13 0 - 44 U/L   Alkaline Phosphatase 116 38 - 126 U/L   Total Bilirubin 0.3 0.3 - 1.2 mg/dL   GFR calc non Af Amer >60 >60 mL/min   GFR calc Af Amer >60 >60 mL/min   Anion gap 8 5 - 15  Type and screen [facility]   Collection Time: 08/08/17  5:57 PM  Result Value Ref Range  ABO/RH(D) B POS    Antibody Screen NEG    Sample Expiration      08/11/2017 Performed at [facility], [address], [place], [place] [address]       Medications:  Scheduled . betamethasone acetate-betamethasone sodium phosphate  12 mg Intramuscular Q24 Hr x 2  . docusate sodium  100 mg Oral Daily  . labetalol  400 mg Oral TID  . prenatal multivitamin  1 tablet Oral Q1200   I have reviewed the patient's current medications.  ASSESSMENT: Patient Active Problem List   Diagnosis Date Noted  . Preeclampsia 08/08/2017  . UTI (urinary tract infection) during pregnancy, second trimester 05/12/2017  . Marijuana use 03/15/2017  . Supervision of normal first pregnancy, antepartum 03/14/2017  . Polysubstance abuse (HCC) 08/30/2011    PLAN: Betamethasone today followed by IOL at 34 weeks  Scheryl Darter 08/09/2017,10:09 AM

## 2017-08-10 LAB — CBC
HEMATOCRIT: 28.7 % — AB (ref 36.0–46.0)
Hemoglobin: 9.6 g/dL — ABNORMAL LOW (ref 12.0–15.0)
MCH: 31.2 pg (ref 26.0–34.0)
MCHC: 33.4 g/dL (ref 30.0–36.0)
MCV: 93.2 fL (ref 78.0–100.0)
Platelets: 183 10*3/uL (ref 150–400)
RBC: 3.08 MIL/uL — AB (ref 3.87–5.11)
RDW: 13.1 % (ref 11.5–15.5)
WBC: 8.5 10*3/uL (ref 4.0–10.5)

## 2017-08-10 LAB — COMPREHENSIVE METABOLIC PANEL
ALK PHOS: 115 U/L (ref 38–126)
ALT: 16 U/L (ref 0–44)
AST: 28 U/L (ref 15–41)
Albumin: 3.1 g/dL — ABNORMAL LOW (ref 3.5–5.0)
Anion gap: 11 (ref 5–15)
BILIRUBIN TOTAL: 0.7 mg/dL (ref 0.3–1.2)
BUN: 10 mg/dL (ref 6–20)
CALCIUM: 7.6 mg/dL — AB (ref 8.9–10.3)
CO2: 18 mmol/L — ABNORMAL LOW (ref 22–32)
CREATININE: 0.77 mg/dL (ref 0.44–1.00)
Chloride: 104 mmol/L (ref 98–111)
Glucose, Bld: 152 mg/dL — ABNORMAL HIGH (ref 70–99)
Potassium: 4.1 mmol/L (ref 3.5–5.1)
Sodium: 133 mmol/L — ABNORMAL LOW (ref 135–145)
TOTAL PROTEIN: 7.2 g/dL (ref 6.5–8.1)

## 2017-08-10 LAB — RAPID HIV SCREEN (HIV 1/2 AB+AG)
HIV 1/2 Antibodies: NONREACTIVE
HIV-1 P24 ANTIGEN - HIV24: NONREACTIVE

## 2017-08-10 LAB — RPR: RPR: NONREACTIVE

## 2017-08-10 MED ORDER — MISOPROSTOL 50MCG HALF TABLET
50.0000 ug | ORAL_TABLET | ORAL | Status: DC
  Administered 2017-08-10 – 2017-08-11 (×10): 50 ug via ORAL
  Filled 2017-08-10 (×9): qty 1

## 2017-08-10 MED ORDER — ONDANSETRON HCL 4 MG/2ML IJ SOLN
4.0000 mg | Freq: Four times a day (QID) | INTRAMUSCULAR | Status: DC | PRN
  Administered 2017-08-10 – 2017-08-11 (×2): 4 mg via INTRAVENOUS
  Filled 2017-08-10 (×2): qty 2

## 2017-08-10 MED ORDER — LACTATED RINGERS IV SOLN: 500.0000 mL | INTRAVENOUS | Status: DC | PRN

## 2017-08-10 MED ORDER — OXYCODONE-ACETAMINOPHEN 5-325 MG PO TABS: 2.0000 | ORAL_TABLET | ORAL | Status: DC | PRN

## 2017-08-10 MED ORDER — FENTANYL CITRATE (PF) 100 MCG/2ML IJ SOLN
50.0000 ug | INTRAMUSCULAR | Status: DC | PRN
  Administered 2017-08-10: 50 ug via INTRAVENOUS
  Administered 2017-08-11 – 2017-08-12 (×6): 100 ug via INTRAVENOUS
  Filled 2017-08-10 (×7): qty 2

## 2017-08-10 MED ORDER — SODIUM CHLORIDE 0.9 % IV SOLN
5.0000 10*6.[IU] | Freq: Once | INTRAVENOUS | Status: AC
  Administered 2017-08-10: 5 10*6.[IU] via INTRAVENOUS
  Filled 2017-08-10: qty 5

## 2017-08-10 MED ORDER — LACTATED RINGERS IV SOLN
INTRAVENOUS | Status: DC
Start: 2017-08-10 — End: 2017-08-13
  Administered 2017-08-10 – 2017-08-12 (×2): via INTRAVENOUS
  Administered 2017-08-12: 100 mL/h via INTRAVENOUS
  Administered 2017-08-12: 11:00:00 via INTRAVENOUS

## 2017-08-10 MED ORDER — SOD CITRATE-CITRIC ACID 500-334 MG/5ML PO SOLN: 30.0000 mL | ORAL | Status: DC | PRN

## 2017-08-10 MED ORDER — ACETAMINOPHEN 325 MG PO TABS: 650.0000 mg | ORAL_TABLET | ORAL | Status: DC | PRN

## 2017-08-10 MED ORDER — PENICILLIN G POT IN DEXTROSE 60000 UNIT/ML IV SOLN
3.0000 10*6.[IU] | INTRAVENOUS | Status: DC
  Administered 2017-08-10 – 2017-08-12 (×10): 3 10*6.[IU] via INTRAVENOUS
  Filled 2017-08-10 (×15): qty 50

## 2017-08-10 MED ORDER — OXYTOCIN 40 UNITS IN LACTATED RINGERS INFUSION - SIMPLE MED: 2.5000 [IU]/h | INTRAVENOUS | Status: DC

## 2017-08-10 MED ORDER — LIDOCAINE HCL (PF) 1 % IJ SOLN
30.0000 mL | INTRAMUSCULAR | Status: DC | PRN
  Filled 2017-08-10: qty 30

## 2017-08-10 MED ORDER — OXYTOCIN BOLUS FROM INFUSION
500.0000 mL | Freq: Once | INTRAVENOUS | Status: AC
  Administered 2017-08-12: 500 mL via INTRAVENOUS

## 2017-08-10 MED ORDER — TERBUTALINE SULFATE 1 MG/ML IJ SOLN: 0.2500 mg | Freq: Once | INTRAMUSCULAR | Status: DC | PRN

## 2017-08-10 MED ORDER — FLEET ENEMA 7-19 GM/118ML RE ENEM: 1.0000 | ENEMA | RECTAL | Status: DC | PRN

## 2017-08-10 MED ORDER — HYDROXYZINE HCL 50 MG PO TABS
50.0000 mg | ORAL_TABLET | Freq: Four times a day (QID) | ORAL | Status: DC | PRN
  Administered 2017-08-10: 50 mg via ORAL
  Filled 2017-08-10 (×3): qty 1

## 2017-08-10 MED ORDER — OXYCODONE-ACETAMINOPHEN 5-325 MG PO TABS: 1.0000 | ORAL_TABLET | ORAL | Status: DC | PRN

## 2017-08-10 NOTE — Progress Notes (Signed)
Inpatient Diabetes Program Recommendations  AACE/ADA: New Consensus Statement on Inpatient Glycemic Control (2015)  Target Ranges:  Prepandial:   less than 140 mg/dL      Peak postprandial:   less than 180 mg/dL (1-2 hours)      Critically ill patients:  140 - 180 mg/dL   Lab Results  Component Value Date   GLUCAP 72 07/01/2011    Review of Glycemic Control Results for Evelyn Mcclain, Evelyn Mcclain (MRN [id number]) as of 08/10/2017 09:43  Ref. Range 08/10/2017 02:36  Glucose Latest Ref Range: 70 - 99 mg/dL 409152 (H)   Diabetes history: none Outpatient Diabetes medications: none Current orders for Inpatient glycemic control: none  Inpatient Diabetes Program Recommendations:    Noted patient received BMZ x 2. In setting of steroids and AM FSBS of 152 mg/dL, consider collecting CBGs Q4H.   Thanks, Lujean RaveLauren Atha Muradyan, MSN, RNC-OB Diabetes Coordinator [phone] (8a-5p)

## 2017-08-10 NOTE — Progress Notes (Signed)
Labor Progress Note Evelyn Mcclain is a 22 y.o. G1P0000 at [redacted]w[redacted]d presented for IOL for severe PEC  S:  Comfortable, no c/o. Denies PEC sx.  O:  BP (!) 152/87 Comment: Pt sitting up eating and talking.  Will recheck.  Pulse 78   Temp 97.8 F (36.6 C) (Oral)   Resp 16   Ht 5\' 7"  (1.702 m)   Wt 172 lb 1.6 oz (78.1 kg)   LMP 12/15/2016   SpO2 96%   BMI 26.95 kg/m  EFM: baseline 135 bpm/ mod variability/ no accels/ no decels  Toco: irritability SVE: closed/60/-3   A/P: 22 y.o. G1P0000 153w0d  1. Labor: latent 2. FWB: Cat II 3. Pain: analgesia prn  S/p Cytotec #1, will place FB when able. BP stable. Continue Cytotec and Magnesium Sulfate. Anticipate SVD.  Evelyn Mcclain, CNM 10:24 AM

## 2017-08-10 NOTE — Progress Notes (Signed)
Patient ID: Lendell Capriceymia M Hisaw, female   DOB: 02/23/1995, 23 y.o.   MRN: [id number] Lendell Capriceymia M Eckels is a 22 y.o. G1P0000 at 2433w0d transferred from 3rd floor for induction of labor due to severe pre-e and FGR.  Subjective: Comfortable, Denies ha, visual changes, ruq/epigastric pain, n/v.    Objective: BP 134/79   Pulse 83   Temp 97.6 F (36.4 C) (Oral)   Resp 18   Ht 5\' 7"  (1.702 m)   Wt 78.1 kg (172 lb 1.6 oz)   LMP 12/15/2016   SpO2 96%   BMI 26.95 kg/m  Total I/O In: 794.8 [P.O.:420; I.V.:374.8] Out: 1400 [Urine:1400]  FHT:  FHR: 130 bpm, variability: moderate,  accelerations:  Present,  decelerations:  Absent UC:   none  SVE:   Dilation: Closed Effacement (%): Thick Station: Ballotable Exam by:: Kbooker, CNM  Vtx by Ecolableopold's    Labs: Lab Results  Component Value Date   WBC 6.7 08/08/2017   HGB 9.8 (L) 08/08/2017   HCT 29.2 (L) 08/08/2017   MCV 93.3 08/08/2017   PLT 188 08/08/2017    Assessment / Plan: Transfer from 3rd floor for IOL d/t severe pre-e and FGR AC <3% w/ EFW 10%, s/p BMZ x 2, on mag, bp's stable, cx LTC, will start cytotec and plan for foley bulb when able  Labor: cervical ripening Fetal Wellbeing:  Category I Pain Control:  n/a Pre-eclampsia: severe, bp stable, will repeat pre-e labs I/D:  PCN for GBS unknown/preterm, culture pending Anticipated MOD:  NSVD  Cheral MarkerKimberly R Irineo Gaulin CNM, WHNP-BC 08/10/2017, 2:08 AM

## 2017-08-10 NOTE — Progress Notes (Signed)
Patient ID: Evelyn Mcclain, female   DOB: 06/21/1995, 22 y.o.   MRN: [id number] Doing well, not feeling contractions  Vitals:   08/10/17 1805 08/10/17 1907 08/10/17 2002 08/10/17 2109  BP: 133/66 (!) 152/92 140/87 (!) 154/92  Pulse: 86 78 74 76  Resp: 18 16 16 16   Temp:    98 F (36.7 C)  TempSrc:    Oral  SpO2:      Weight:      Height:       FHR reassuring UCs every 4-6 min  Dilation: Closed Effacement (%): 50 Cervical Position: Posterior Station: -3 Presentation: Vertex Exam by:: Bhambri CNM  Will continue Cytotec

## 2017-08-10 NOTE — Progress Notes (Signed)
Labor Progress Note Evelyn Mcclain is a 22 y.o. G1P0000 at 3811w0d presented for IOL for PEC w/severe features  S:  Comfortable, no c/o or pain.  O:  BP (!) 152/92   Pulse 78   Temp 97.9 F (36.6 C) (Oral)   Resp 16   Ht 5\' 7"  (1.702 m)   Wt 172 lb 1.6 oz (78.1 kg)   LMP 12/15/2016   SpO2 96%   BMI 26.95 kg/m  EFM: baseline 135 bpm/ min variability/ no accels/ no decels  Toco: 5-6 SVE: Dilation: Closed Effacement (%): 50 Cervical Position: Posterior Station: -3 Presentation: Vertex Exam by:: Evelyn Mcclain CNM  A/P: 22 y.o. G1P0000 4011w0d  1. Labor: latent 2. FWB: Cat II 3. Pain: analgesia prn  Continue Cytotec, place FB when able. BP stable. Anticipate labor progression and SVD.  Donette LarryMelanie Areonna Bran, CNM 7:14 PM

## 2017-08-10 NOTE — Progress Notes (Addendum)
Evelyn Mcclain is a 22 y.o. G1P0000 at 3243w0d by LMP admitted for IOL due to Pre-E with severe features.   Subjective:  No complains of painful contractions, no fluid loss and no blood loss, denies headache and visual disturbances, no RUQ pain.   Objective: BP 137/67   Pulse 79   Temp 97.9 F (36.6 C) (Oral)   Resp 18   Ht 5\' 7"  (1.702 m)   Wt 78.1 kg (172 lb 1.6 oz)   LMP 12/15/2016   SpO2 96%   BMI 26.95 kg/m  I/O last 3 completed shifts: In: 4400.5 [P.O.:2060; I.V.:2340.5] Out: 5800 [Urine:5800] Total I/O In: 1142.5 [P.O.:530; I.V.:562.5; IV Piggyback:50] Out: 1550 [Urine:1550]  FHT:  FHR: 130 bpm, variability: Pattern is consistent with fetal sleep pattern accel stimulatited with auditory stimulation,  accelerations:  Present,  decelerations:  Absent UC:   regular, every 4 minutes SVE:   Dilation: Closed Effacement (%): 60 Station: -3 Exam by:: Evelyn Mcclain, CNM @ 10:24AM  Labs: Lab Results  Component Value Date   WBC 8.5 08/10/2017   HGB 9.6 (L) 08/10/2017   HCT 28.7 (L) 08/10/2017   MCV 93.2 08/10/2017   PLT 183 08/10/2017    Assessment / Plan: IOL due to pre-e (cytotech)  1. Monitor expectantly and for pain for onset of labour or other changes. 2. Fetal monitoring to continue. 3. Monitor maternal vitals   Labor: Progressing normally and on cytotec Preeclampsia:  on magnesium sulfate Fetal Wellbeing:  Category II Pain Control:  Labor support without medications I/D:  n/a Anticipated MOD:  NSVD  Evelyn Mcclain 08/10/2017, 3:17 PM

## 2017-08-11 ENCOUNTER — Encounter (HOSPITAL_COMMUNITY): Payer: Self-pay | Admitting: *Deleted

## 2017-08-11 LAB — CULTURE, BETA STREP (GROUP B ONLY)

## 2017-08-11 MED ORDER — OXYTOCIN 40 UNITS IN LACTATED RINGERS INFUSION - SIMPLE MED
1.0000 m[IU]/min | INTRAVENOUS | Status: DC
  Administered 2017-08-12: 2 m[IU]/min via INTRAVENOUS
  Filled 2017-08-11: qty 1000

## 2017-08-11 MED ORDER — TERBUTALINE SULFATE 1 MG/ML IJ SOLN: 0.2500 mg | Freq: Once | INTRAMUSCULAR | Status: DC | PRN

## 2017-08-11 NOTE — Progress Notes (Signed)
   Evelyn Mcclain is a 22 y.o. G1P0000 at 8531w1d  admitted for hypertension and FGR.   Subjective: Patient sleeping in bed; foley bulb still in place.   Objective: No intake/output data recorded.  FHT:  FHR: 125 bpm, variability: moderate,  accelerations:  Present,  decelerations:  Absent UC:   irregular, every 2-4 minutes SVE:   Dilation: Fingertip Effacement (%): 50 Station: -3 Exam by:: Dr. Veto KempsAnson S/p 9 doses of cytotec; FP still in place.  Labs: Vitals:   08/11/17 1802 08/11/17 1832 08/11/17 1901 08/11/17 1931  BP: (!) 156/94 139/80 (!) 142/86 (!) 155/95  Pulse: 77 79 79 79  Resp:   16 16  Temp:      TempSrc:      SpO2:      Weight:      Height:        Assessment / Plan: early labor FB in place; continue cytotec, continue mag. BP without severe range at this point.  Labor: early labor Fetal Wellbeing:  Category I Pain Control:  Labor support without medications Anticipated MOD:  NSVD  Evelyn Mcclain 08/11/2017, 7:34 PM

## 2017-08-11 NOTE — Progress Notes (Signed)
Vitals:   08/11/17 2102 08/11/17 2116  BP: (!) 164/95 (!) 154/91  Pulse: 81 82  Resp: 16 16  Temp:    SpO2:     Foley fell out around 2000, right after she had an oral cytotec.  Will defer exam until MN when we can start pitocin.  FHR Cat 1, irregular and mild ctx. MgS04 at 2gm/hr.

## 2017-08-11 NOTE — Progress Notes (Signed)
   Evelyn Mcclain is a 22 y.o. G1P0000 at 1513w1d  admitted for induction of labor due to pre-eclampsia due to severe features (BP and pcr) and poor fetal growth.   Subjective: She denies HA, blurry vision, epigastric pain or sudden swelling.   Objective: Vitals:   08/11/17 1332 08/11/17 1402 08/11/17 1432 08/11/17 1502  BP: 134/73 134/69 135/70 (!) 155/82  Pulse: 90 86 90 84  Resp:  15  18  Temp:    97.8 F (36.6 C)  TempSrc:    Oral  SpO2:      Weight:      Height:       Total I/O In: 1687.7 [P.O.:900; I.V.:787.7] Out: 1400 [Urine:1400]  FHT:  FHR: 135 bpm, variability: minimal ,  accelerations:  Abscent,  decelerations:  Absent UC:   irregular, every 2-3 minutes SVE:   Dilation: Fingertip Effacement (%): 50 Station: -3 Exam by:: Evelyn Mcclain   Labs: Lab Results  Component Value Date   WBC 8.5 08/10/2017   HGB 9.6 (L) 08/10/2017   HCT 28.7 (L) 08/10/2017   MCV 93.2 08/10/2017   PLT 183 08/10/2017    Assessment / Plan: early labor -Has been getting cytotec q 4 hours -BP has been borderline severe; Continue magnesium and notify CNM if any severe range pressures.  Labor: early labor Fetal Wellbeing:  Category I. FHtracing is appropriate for gestatioanal age and mag infusion.  Pain Control:  IV pain meds Anticipated MOD:  NSVD  [facility]elyn [facility]clain [facility] 08/11/2017, 3:19 PM

## 2017-08-11 NOTE — Progress Notes (Signed)
Patient ID: Evelyn Mcclain, female   DOB: 09/07/1995, 22 y.o.   MRN: [id number]  Patient resting.  BP has been stable around 140-150 systolic.   FHR appear normal.  No decels.  On magnesium 2g/hr.   Augmentation: next cytotec dose at 3am, membranes intact.       BP 140/81   Pulse 79   Temp 98 F (36.7 C) (Oral)   Resp 16   Ht 5\' 7"  (1.702 m)   Wt 78.1 kg (172 lb 1.6 oz)   LMP 12/15/2016   SpO2 96%   BMI 26.95 kg/m    Dilation: Fingertip Effacement (%): 50 Cervical Position: Posterior Station: -3 Presentation: Vertex Exam by:: Hermenia BersMichelle Kelly, RN

## 2017-08-11 NOTE — Progress Notes (Signed)
Evelyn Mcclain is a 22 y.o. G1P0000 at 4191w1d by ultrasound admitted for induction of labor due to Hypertension and Poor fetal growth.  Subjective: Pt reports increased pain with contractions. She is hungry.    Objective: BP (!) 149/90   Pulse 72   Temp 97.9 F (36.6 C) (Oral)   Resp 16   Ht 5\' 7"  (1.702 m)   Wt 172 lb 1.6 oz (78.1 kg)   LMP 12/15/2016   SpO2 96%   BMI 26.95 kg/m  I/O last 3 completed shifts: In: 4982.4 [P.O.:2280; I.V.:2440.7; IV Piggyback:261.7] Out: 7425 [Urine:7425] Total I/O In: 460.1 [P.O.:240; I.V.:220.1] Out: 800 [Urine:800]  FHT:  FHR: 130's bpm, variability: minimal ,  accelerations:  Present,  decelerations:  Absent UC:   Becoming more regular.   SVE:   Dilation: Fingertip Effacement (%): 50 Station: -3 Exam by:: Hermenia BersMichelle Kelly, RN  Labs: Lab Results  Component Value Date   WBC 8.5 08/10/2017   HGB 9.6 (L) 08/10/2017   HCT 28.7 (L) 08/10/2017   MCV 93.2 08/10/2017   PLT 183 08/10/2017    Assessment / Plan: Induction of labor due to preeclampsia,  progressing well on pitocin  Labor: not in labor. currently begin ripened.  Preeclampsia:  on magnesium sulfate Fetal Wellbeing:  Category I Pain Control:  Labor support without medications I/D:  n/a Anticipated MOD:  NSVD  Willodean RosenthalCarolyn Harraway-Smith 08/11/2017, 9:50 AM

## 2017-08-11 NOTE — Progress Notes (Signed)
Labor Progress Note Evelyn Mcclain is a 22 y.o. G1P0000 at 3651w1d presented for IOL due to pre-eclampsia and FGR.   S:   Patient is currently resting. Her FHR is around 130 bpm, moderate variability, no decelerations. The patient's blood pressures remain elevated in mostly the 140s-150s.   O:  BP 140/81   Pulse 79   Temp 98 F (36.7 C) (Oral)   Resp 16   Ht 5\' 7"  (1.702 m)   Wt 78.1 kg (172 lb 1.6 oz)   LMP 12/15/2016   SpO2 96%   BMI 26.95 kg/m    Vitals:   08/10/17 2002 08/10/17 2109 08/10/17 2302 08/11/17 0002  BP: 140/87 (!) 154/92 (!) 155/89 140/81  Pulse: 74 76 76 79  Resp: 16 16 16 16   Temp:  98 F (36.7 C)    TempSrc:  Oral    SpO2:      Weight:      Height:         CVE: Dilation: Fingertip Effacement (%): 50 Cervical Position: Posterior Station: -3 Presentation: Vertex Exam by:: Hermenia BersMichelle Kelly, RN   A&P: 22 y.o. G1P0000 6151w1d IOL for pre-eclampsia and FGR #Labor: Progressing well. On Cytotec PO with next dose at 0300. #FWB: Category I #GBS pending  Candie MileLindsey R Alessandria Henken, Student-PA 2:08 AM

## 2017-08-12 ENCOUNTER — Inpatient Hospital Stay (HOSPITAL_COMMUNITY): Payer: Medicaid Other | Admitting: Anesthesiology

## 2017-08-12 ENCOUNTER — Encounter (HOSPITAL_COMMUNITY): Payer: Self-pay | Admitting: *Deleted

## 2017-08-12 DIAGNOSIS — O1494 Unspecified pre-eclampsia, complicating childbirth: Secondary | ICD-10-CM

## 2017-08-12 DIAGNOSIS — Z3A33 33 weeks gestation of pregnancy: Secondary | ICD-10-CM

## 2017-08-12 MED ORDER — OXYCODONE HCL 5 MG PO TABS: 5.0000 mg | ORAL_TABLET | ORAL | Status: DC | PRN

## 2017-08-12 MED ORDER — BISACODYL 10 MG RE SUPP: 10.0000 mg | Freq: Every day | RECTAL | Status: DC | PRN

## 2017-08-12 MED ORDER — LIDOCAINE HCL (PF) 1 % IJ SOLN
INTRAMUSCULAR | Status: DC | PRN
  Administered 2017-08-12: 13 mL via EPIDURAL

## 2017-08-12 MED ORDER — OXYCODONE HCL 5 MG PO TABS: 10.0000 mg | ORAL_TABLET | ORAL | Status: DC | PRN

## 2017-08-12 MED ORDER — METHYLERGONOVINE MALEATE 0.2 MG/ML IJ SOLN: 0.2000 mg | INTRAMUSCULAR | Status: DC | PRN

## 2017-08-12 MED ORDER — HYDRALAZINE HCL 20 MG/ML IJ SOLN
10.0000 mg | Freq: Once | INTRAMUSCULAR | Status: DC | PRN
Start: 2017-08-12 — End: 2017-08-13

## 2017-08-12 MED ORDER — FERROUS SULFATE 325 (65 FE) MG PO TABS
325.0000 mg | ORAL_TABLET | Freq: Two times a day (BID) | ORAL | Status: DC
  Administered 2017-08-12 – 2017-08-17 (×10): 325 mg via ORAL
  Filled 2017-08-12 (×10): qty 1

## 2017-08-12 MED ORDER — EPHEDRINE 5 MG/ML INJ
10.0000 mg | INTRAVENOUS | Status: DC | PRN
  Filled 2017-08-12: qty 2

## 2017-08-12 MED ORDER — FENTANYL 2.5 MCG/ML BUPIVACAINE 1/10 % EPIDURAL INFUSION (WH - ANES)
14.0000 mL/h | INTRAMUSCULAR | Status: DC | PRN
  Administered 2017-08-12: 14 mL/h via EPIDURAL
  Filled 2017-08-12: qty 100

## 2017-08-12 MED ORDER — PRENATAL MULTIVITAMIN CH
1.0000 | ORAL_TABLET | Freq: Every day | ORAL | Status: DC
  Administered 2017-08-12 – 2017-08-17 (×6): 1 via ORAL
  Filled 2017-08-12 (×6): qty 1

## 2017-08-12 MED ORDER — COCONUT OIL OIL: 1.0000 "application " | TOPICAL_OIL | Status: DC | PRN

## 2017-08-12 MED ORDER — ONDANSETRON HCL 4 MG PO TABS: 4.0000 mg | ORAL_TABLET | ORAL | Status: DC | PRN

## 2017-08-12 MED ORDER — NIFEDIPINE ER OSMOTIC RELEASE 30 MG PO TB24: 30.0000 mg | ORAL_TABLET | Freq: Two times a day (BID) | ORAL | Status: DC

## 2017-08-12 MED ORDER — IBUPROFEN 600 MG PO TABS
600.0000 mg | ORAL_TABLET | Freq: Four times a day (QID) | ORAL | Status: DC
Start: 2017-08-12 — End: 2017-08-13
  Administered 2017-08-12 – 2017-08-13 (×4): 600 mg via ORAL
  Filled 2017-08-12 (×4): qty 1

## 2017-08-12 MED ORDER — LACTATED RINGERS IV SOLN: 500.0000 mL | Freq: Once | INTRAVENOUS | Status: DC

## 2017-08-12 MED ORDER — ZOLPIDEM TARTRATE 5 MG PO TABS: 5.0000 mg | ORAL_TABLET | Freq: Every evening | ORAL | Status: DC | PRN

## 2017-08-12 MED ORDER — FLEET ENEMA 7-19 GM/118ML RE ENEM: 1.0000 | ENEMA | Freq: Every day | RECTAL | Status: DC | PRN

## 2017-08-12 MED ORDER — MAGNESIUM SULFATE 40 G IN LACTATED RINGERS - SIMPLE
2.0000 g/h | INTRAVENOUS | Status: DC
  Administered 2017-08-13: 2 g/h via INTRAVENOUS
  Filled 2017-08-12: qty 40
  Filled 2017-08-12: qty 500

## 2017-08-12 MED ORDER — WITCH HAZEL-GLYCERIN EX PADS: 1.0000 "application " | MEDICATED_PAD | CUTANEOUS | Status: DC | PRN

## 2017-08-12 MED ORDER — MEASLES, MUMPS & RUBELLA VAC ~~LOC~~ INJ
0.5000 mL | INJECTION | Freq: Once | SUBCUTANEOUS | Status: DC
  Filled 2017-08-12: qty 0.5

## 2017-08-12 MED ORDER — DOCUSATE SODIUM 100 MG PO CAPS
100.0000 mg | ORAL_CAPSULE | Freq: Two times a day (BID) | ORAL | Status: DC
  Administered 2017-08-12: 100 mg via ORAL
  Filled 2017-08-12: qty 1

## 2017-08-12 MED ORDER — ONDANSETRON HCL 4 MG/2ML IJ SOLN: 4.0000 mg | INTRAMUSCULAR | Status: DC | PRN

## 2017-08-12 MED ORDER — ACETAMINOPHEN 325 MG PO TABS
650.0000 mg | ORAL_TABLET | ORAL | Status: DC | PRN
  Administered 2017-08-15 (×2): 650 mg via ORAL
  Filled 2017-08-12 (×2): qty 2

## 2017-08-12 MED ORDER — PHENYLEPHRINE 40 MCG/ML (10ML) SYRINGE FOR IV PUSH (FOR BLOOD PRESSURE SUPPORT)
80.0000 ug | PREFILLED_SYRINGE | INTRAVENOUS | Status: DC | PRN
  Filled 2017-08-12: qty 5

## 2017-08-12 MED ORDER — DIBUCAINE 1 % RE OINT: 1.0000 "application " | TOPICAL_OINTMENT | RECTAL | Status: DC | PRN

## 2017-08-12 MED ORDER — PHENYLEPHRINE 40 MCG/ML (10ML) SYRINGE FOR IV PUSH (FOR BLOOD PRESSURE SUPPORT)
80.0000 ug | PREFILLED_SYRINGE | INTRAVENOUS | Status: DC | PRN
  Filled 2017-08-12: qty 5
  Filled 2017-08-12: qty 10

## 2017-08-12 MED ORDER — HYDRALAZINE HCL 20 MG/ML IJ SOLN
5.0000 mg | INTRAMUSCULAR | Status: AC | PRN
  Administered 2017-08-12: 10 mg via INTRAVENOUS
  Administered 2017-08-12: 5 mg via INTRAVENOUS
  Filled 2017-08-12 (×2): qty 1

## 2017-08-12 MED ORDER — METHYLERGONOVINE MALEATE 0.2 MG PO TABS: 0.2000 mg | ORAL_TABLET | ORAL | Status: DC | PRN

## 2017-08-12 MED ORDER — LABETALOL HCL 5 MG/ML IV SOLN
20.0000 mg | INTRAVENOUS | Status: AC | PRN
  Administered 2017-08-12: 20 mg via INTRAVENOUS
  Administered 2017-08-12: 40 mg via INTRAVENOUS
  Administered 2017-08-12: 80 mg via INTRAVENOUS
  Filled 2017-08-12: qty 16
  Filled 2017-08-12: qty 8
  Filled 2017-08-12: qty 4

## 2017-08-12 MED ORDER — TETANUS-DIPHTH-ACELL PERTUSSIS 5-2.5-18.5 LF-MCG/0.5 IM SUSP: 0.5000 mL | Freq: Once | INTRAMUSCULAR | Status: DC

## 2017-08-12 MED ORDER — DIPHENHYDRAMINE HCL 25 MG PO CAPS: 25.0000 mg | ORAL_CAPSULE | Freq: Four times a day (QID) | ORAL | Status: DC | PRN

## 2017-08-12 MED ORDER — BENZOCAINE-MENTHOL 20-0.5 % EX AERO: 1.0000 "application " | INHALATION_SPRAY | CUTANEOUS | Status: DC | PRN

## 2017-08-12 MED ORDER — SIMETHICONE 80 MG PO CHEW: 80.0000 mg | CHEWABLE_TABLET | ORAL | Status: DC | PRN

## 2017-08-12 MED ORDER — DIPHENHYDRAMINE HCL 50 MG/ML IJ SOLN: 12.5000 mg | INTRAMUSCULAR | Status: DC | PRN

## 2017-08-12 NOTE — Anesthesia Procedure Notes (Signed)
Epidural Patient location during procedure: OB Start time: 08/12/2017 7:52 AM End time: 08/12/2017 8:07 AM  Staffing Anesthesiologist: Lowella CurbMiller, Warren Ray, MD Performed: anesthesiologist   Preanesthetic Checklist Completed: patient identified, site marked, surgical consent, pre-op evaluation, timeout performed, IV checked, risks and benefits discussed and monitors and equipment checked  Epidural Patient position: sitting Prep: ChloraPrep Patient monitoring: heart rate, cardiac monitor, continuous pulse ox and blood pressure Approach: midline Location: L2-L3 Injection technique: LOR saline  Needle:  Needle type: Tuohy  Needle gauge: 17 G Needle length: 9 cm Needle insertion depth: 6 cm Catheter type: closed end flexible Catheter size: 20 Guage Catheter at skin depth: 10 cm Test dose: negative  Assessment Events: blood not aspirated, injection not painful, no injection resistance, negative IV test and no paresthesia  Additional Notes Reason for block:procedure for pain

## 2017-08-12 NOTE — Progress Notes (Signed)
Patient ID: Evelyn Mcclain, female   DOB: 07/14/1995, 22 y.o.   MRN: [id number]   Checked on patient at midnight during cervical check.  Patient stated she was having nausea, but no HA, blurry vision, or abdominal pain.  Nurse performed cervical check, which was 4/60/-3. Pitocin was then started.    Augmentation: s/p cytotec x9; pitocin started; foley bulb out; membranes intact.   BP (!) 148/85   Pulse 72   Temp 97.8 F (36.6 C) (Oral)   Resp 16   Ht 5\' 7"  (1.702 m)   Wt 78.1 kg (172 lb 1.6 oz)   LMP 12/15/2016   SpO2 96%   BMI 26.95 kg/m   Dilation: 4 Effacement (%): 60 Cervical Position: Posterior Station: -3 Presentation: Vertex Exam by:: Hermenia BersMichelle Kelly, RN

## 2017-08-12 NOTE — Progress Notes (Signed)
Patient ID: Evelyn Mcclain, female   DOB: 02/21/1995, 22 y.o.   MRN: [id number]  Checked on patient who was resting, but did say that her contractions were starting to hurt more.  Otherwise, patient had no complaints.  Cervical exam was performed and there was slight change from previous exam to 5/60/-3.  Blood pressures have been consistent throughout the day at slightly below severe range, with one recent BP at 174/100, but has come down since.   Augmentation: patient currently receiving pitocin 9212mu/min.  Membranes intact.  On magnesium for severe pre E.    BP (!) 155/91   Pulse 76   Temp 98 F (36.7 C) (Axillary)   Resp 16   Ht 5\' 7"  (1.702 m)   Wt 78.1 kg (172 lb 1.6 oz)   LMP 12/15/2016   SpO2 96%   BMI 26.95 kg/m    Dilation: 5 Effacement (%): 60 Cervical Position: Posterior Station: -3 Presentation: Vertex Exam by:: Hermenia BersMichelle Kelly, RN

## 2017-08-12 NOTE — Anesthesia Preprocedure Evaluation (Signed)
Anesthesia Evaluation  Patient identified by MRN, date of birth, ID band Patient awake    Reviewed: Allergy & Precautions, NPO status , Patient's Chart, lab work & pertinent test results  Airway Mallampati: II  TM Distance: >3 FB Neck ROM: Full    Dental no notable dental hx.    Pulmonary neg pulmonary ROS, Current Smoker,    Pulmonary exam normal breath sounds clear to auscultation       Cardiovascular hypertension, negative cardio ROS Normal cardiovascular exam Rhythm:Regular Rate:Normal     Neuro/Psych negative neurological ROS  negative psych ROS   GI/Hepatic negative GI ROS, Neg liver ROS,   Endo/Other  negative endocrine ROS  Renal/GU negative Renal ROS  negative genitourinary   Musculoskeletal negative musculoskeletal ROS (+)   Abdominal   Peds negative pediatric ROS (+)  Hematology negative hematology ROS (+)   Anesthesia Other Findings   Reproductive/Obstetrics negative OB ROS (+) Pregnancy                             Anesthesia Physical Anesthesia Plan  ASA: II  Anesthesia Plan: Epidural   Post-op Pain Management:    Induction:   PONV Risk Score and Plan:   Airway Management Planned:   Additional Equipment:   Intra-op Plan:   Post-operative Plan:   Informed Consent:   Plan Discussed with:   Anesthesia Plan Comments:         Anesthesia Quick Evaluation  

## 2017-08-13 LAB — CBC
HEMATOCRIT: 29.4 % — AB (ref 36.0–46.0)
Hemoglobin: 9.7 g/dL — ABNORMAL LOW (ref 12.0–15.0)
MCH: 31.1 pg (ref 26.0–34.0)
MCHC: 33 g/dL (ref 30.0–36.0)
MCV: 94.2 fL (ref 78.0–100.0)
Platelets: 181 10*3/uL (ref 150–400)
RBC: 3.12 MIL/uL — ABNORMAL LOW (ref 3.87–5.11)
RDW: 13 % (ref 11.5–15.5)
WBC: 7 10*3/uL (ref 4.0–10.5)

## 2017-08-13 LAB — COMPREHENSIVE METABOLIC PANEL
ALT: 26 U/L (ref 0–44)
AST: 31 U/L (ref 15–41)
Albumin: 2.5 g/dL — ABNORMAL LOW (ref 3.5–5.0)
Alkaline Phosphatase: 108 U/L (ref 38–126)
Anion gap: 9 (ref 5–15)
BILIRUBIN TOTAL: 0.6 mg/dL (ref 0.3–1.2)
BUN: 6 mg/dL (ref 6–20)
CHLORIDE: 102 mmol/L (ref 98–111)
CO2: 25 mmol/L (ref 22–32)
CREATININE: 0.75 mg/dL (ref 0.44–1.00)
Calcium: 7.2 mg/dL — ABNORMAL LOW (ref 8.9–10.3)
Glucose, Bld: 81 mg/dL (ref 70–99)
POTASSIUM: 3.4 mmol/L — AB (ref 3.5–5.1)
Sodium: 136 mmol/L (ref 135–145)
TOTAL PROTEIN: 6.3 g/dL — AB (ref 6.5–8.1)

## 2017-08-13 MED ORDER — LABETALOL HCL 200 MG PO TABS
500.0000 mg | ORAL_TABLET | Freq: Three times a day (TID) | ORAL | Status: DC
  Administered 2017-08-13 (×2): 500 mg via ORAL
  Filled 2017-08-13 (×2): qty 1

## 2017-08-13 MED ORDER — HYDRALAZINE HCL 20 MG/ML IJ SOLN
5.0000 mg | INTRAMUSCULAR | Status: AC | PRN
  Administered 2017-08-13: 5 mg via INTRAVENOUS
  Administered 2017-08-13: 10 mg via INTRAVENOUS
  Filled 2017-08-13: qty 1

## 2017-08-13 MED ORDER — FUROSEMIDE 10 MG/ML IJ SOLN
10.0000 mg | Freq: Once | INTRAMUSCULAR | Status: AC
  Administered 2017-08-13: 10 mg via INTRAVENOUS
  Filled 2017-08-13: qty 1

## 2017-08-13 MED ORDER — DOCUSATE SODIUM 100 MG PO CAPS: 100.0000 mg | ORAL_CAPSULE | Freq: Two times a day (BID) | ORAL | Status: DC | PRN

## 2017-08-13 MED ORDER — IBUPROFEN 600 MG PO TABS
600.0000 mg | ORAL_TABLET | Freq: Four times a day (QID) | ORAL | Status: DC | PRN
  Administered 2017-08-14: 600 mg via ORAL
  Filled 2017-08-13 (×2): qty 1

## 2017-08-13 MED ORDER — ENALAPRIL MALEATE 2.5 MG PO TABS
2.5000 mg | ORAL_TABLET | Freq: Every day | ORAL | Status: DC
  Administered 2017-08-13 – 2017-08-14 (×2): 2.5 mg via ORAL
  Filled 2017-08-13 (×3): qty 1

## 2017-08-13 MED ORDER — LABETALOL HCL 200 MG PO TABS
600.0000 mg | ORAL_TABLET | Freq: Three times a day (TID) | ORAL | Status: DC
  Filled 2017-08-13: qty 3

## 2017-08-13 MED ORDER — OXYCODONE HCL 5 MG PO TABS
5.0000 mg | ORAL_TABLET | Freq: Four times a day (QID) | ORAL | Status: DC | PRN
  Administered 2017-08-15 – 2017-08-16 (×2): 5 mg via ORAL
  Filled 2017-08-13 (×2): qty 1

## 2017-08-13 MED ORDER — LABETALOL HCL 100 MG PO TABS
100.0000 mg | ORAL_TABLET | Freq: Once | ORAL | Status: AC
  Administered 2017-08-13: 100 mg via ORAL
  Filled 2017-08-13: qty 1

## 2017-08-13 NOTE — Anesthesia Postprocedure Evaluation (Signed)
Anesthesia Post Note  Patient: Evelyn Mcclain  Procedure(s) Performed: AN AD HOC LABOR EPIDURAL     Patient location during evaluation: [facility] Anesthesia Type: Epidural Level of consciousness: awake and alert Pain management: pain level controlled Vital Signs Assessment: post-procedure vital signs reviewed and stable Respiratory status: spontaneous breathing Cardiovascular status: stable Postop Assessment: no headache, no backache, no apparent nausea or vomiting, patient able to bend at knees, epidural receding, adequate PO intake and able to ambulate Anesthetic complications: no    Last Vitals:  Vitals:   08/12/17 2315 08/13/17 0318  BP: (!) 141/82 (!) 153/92  Pulse: 82 69  Resp: 19 14  Temp: 36.9 C 37.2 C  SpO2: 100% 100%    Last Pain:  Vitals:   08/13/17 0637  TempSrc:   PainSc: 0-No pain   Pain Goal: Patients Stated Pain Goal: 3 (08/12/17 2334)               Salome ArntSterling, Dorin [place] Marie

## 2017-08-13 NOTE — Lactation Note (Signed)
This note was copied from a baby's chart. Lactation Consultation Note  Patient Name: Evelyn Mcclain [id number]'s Date: 08/13/2017 Reason for consult: Initial assessment;Late-preterm 34-36.6wks;NICU baby;Infant < 6lbs;Other (Comment)(IUGR/referral by RN) Mom reports she has pumped 1 time.Urged pumping 8-10 times/day.  Reviewed importance of pumping to make more milk come. Mom reports she did not get anything when she pumped. Mom reports she has no breastpump for home use.  [facility] med[facility] [place]  [facility] but she would also have to follow up with them on Monday.  Patient reports she has been told about massage and hand expression.  When tried to review NICU booklet with patient, patient reported she had al ready heard all that. And [facility]  Asked mom if it would be okay to follow up with patient.  She reported that was fine.    Maternal Data Has patient been taught Hand Expression?: (patient reported yes) Does the patient have breastfeeding experience prior to this delivery?: No  Feeding    LATCH Score                   Interventions    Lactation Tools Discussed/Used WIC Program: No Pump Review: Setup, frequency, and cleaning;Milk Storage   Consult Status Consult Status: Follow-up Date: 08/14/17 Follow-up type: In-patient    Evelyn Mcclain 08/13/2017, 5:43 PM

## 2017-08-13 NOTE — Progress Notes (Signed)
Post Partum Day 1 IOL d/t SPEC Subjective: Pt without complaints this morning. Denies HA, visual changes or epigastric pain. Up to restroom without problems. Tolerating diet. Pain controlled. Bottle feeding.   Objective: Blood pressure (!) 153/92, pulse 69, temperature 98.9 F (37.2 C), temperature source Oral, resp. rate 14, height 5\' 7"  (1.702 m), weight 78.1 kg (172 lb 1.6 oz), last menstrual period 12/15/2016, SpO2 100 %, unknown if currently breastfeeding.  Physical Exam:  General: alert Lochia: appropriate Uterine Fundus: firm Incision: NA DVT Evaluation: No evidence of DVT seen on physical exam, 1+ edema, nl DTR's  No results for input(s): HGB, HCT in the last 72 hours.  Assessment/Plan: PPD # 1 SVD SPEC  Stable. Magnesium x 24 hrs. Start Vasotec. Continue to monitor BP. Continue with supportive care   LOS: 5 days   Hermina StaggersMichael L Anitria Andon 08/13/2017, 7:43 AM

## 2017-08-14 MED ORDER — LABETALOL HCL 200 MG PO TABS
800.0000 mg | ORAL_TABLET | Freq: Three times a day (TID) | ORAL | Status: DC
  Administered 2017-08-14 – 2017-08-17 (×10): 800 mg via ORAL
  Filled 2017-08-14 (×8): qty 4
  Filled 2017-08-14: qty 8
  Filled 2017-08-14: qty 4

## 2017-08-14 NOTE — Lactation Note (Signed)
This note was copied from a baby's chart. Lactation Consultation Note  Patient Name: Evelyn Mcclain Today's Date: 08/14/2017   Hand expression was taught to Mom. She was pleased to see the amount (few mLs that was expressed), which will be taken to the NICU. Mom reports + breast changes w/pregnancy.   Mom has only pumped once since being set up with the pump. Benefits of breast milk discussed.  Mom was shown how to assemble & use hand pump (double- & single-mode) that was included in pump kit.    Matthias Hughs [facility] 08/14/2017, 8:42 PM

## 2017-08-14 NOTE — Lactation Note (Signed)
This note was copied from a baby's chart. Lactation Consultation Note  Patient Name: Evelyn Mcclain Today's Date: 08/14/2017   I entered room to ask if she had any questions about pumping. She denied having any questions, but was interested in learning about hand expression. Mom has multiple visitors in room; she agreed that I could return in about 15 minutes or so.  Mom takes enalapril (L2) 2.5 mg qd; Mom is currently taking labetalol (L2) 800mg  tid.    Lurline HareRichey, Carmon Brigandi [facility] 08/14/2017, 7:10 PM

## 2017-08-14 NOTE — Progress Notes (Addendum)
Daily Antepartum Note  Admission Date: 08/08/2017 Current Date: 08/14/2017 8:22 AM  Evelyn Mcclain is a 22 y.o. G1P0101 PPD#3/intact perineum @ 38/2, admitted for severe pre-eclampsia  Pregnancy complicated by: Patient Active Problem List   Diagnosis Date Noted  . Preeclampsia 08/08/2017  . UTI (urinary tract infection) during pregnancy, second trimester 05/12/2017  . Marijuana use 03/15/2017  . Supervision of normal first pregnancy, antepartum 03/14/2017  . Polysubstance abuse ([place]) 08/30/2011    Overnight/24hr events:  titrating her labetalol; pt asymptomatic. Got IV hydral last night  Subjective:  No s/s of pre-eclampsia  Objective:    Current Vital Signs 24h Vital Sign Ranges  T 98.7 F (37.1 C) Temp  Avg: 98.8 F (37.1 C)  Min: 98.2 F (36.8 C)  Max: 99.5 F (37.5 C)  BP (!) 159/94 BP  Min: 126/83  Max: 171/99  HR 69 Pulse  Avg: 72.8  Min: 63  Max: 95  RR 17 Resp  Avg: 17  Min: 14  Max: 18  SaO2 100 % Room Air SpO2  Avg: 99.2 %  Min: 97 %  Max: 100 %       24 Hour I/O Current Shift I/O  Time Ins Outs 07/20 0701 - 07/21 0700 In: 925.5 [P.O.:750; I.V.:175.5] Out: 1050 [Urine:1050] No intake/output data recorded.   Patient Vitals for the past 24 hrs:  BP Temp Temp src Pulse Resp SpO2  08/14/17 0746 (!) 159/94 98.7 F (37.1 C) Oral 69 17 100 %  08/14/17 0441 (!) 151/88 98.2 F (36.8 C) Oral 75 16 100 %  08/14/17 0002 137/72 - - 91 18 100 %  08/13/17 2326 (!) 162/95 98.4 F (36.9 C) Oral 79 14 99 %  08/13/17 2220 (!) 168/82 - - 67 - -  08/13/17 2133 (!) 164/88 - - 69 - -  08/13/17 2058 (!) 171/99 - - 67 - -  08/13/17 2030 (!) 163/99 99.5 F (37.5 C) Oral 63 16 99 %  08/13/17 1742 126/83 99.1 F (37.3 C) Oral 95 18 100 %  08/13/17 1730 139/80 - - 70 - -  08/13/17 1714 (!) 165/89 99 F (37.2 C) Oral 70 18 97 %  08/13/17 1335 (!) 166/89 - - 65 18 99 %  08/13/17 0847 (!) 155/99 98.7 F (37.1 C) Oral 66 18 99 %    Physical exam: General: Well nourished,  well developed female in no acute distress. Abdomen: nttp Cardiovascular: S1, S2 normal, no murmur, rub or gallop, regular rate and rhythm Respiratory: CTAB Extremities: no clubbing, cyanosis or edema Skin: Warm and dry.   Medications: Current Facility-Administered Medications  Medication Dose Route Frequency Provider Last Rate Last Dose  . acetaminophen (TYLENOL) tablet 650 mg  650 mg Oral Q4H PRN Christin Fudge, CNM      . benzocaine-Menthol (DERMOPLAST) 20-0.5 % topical spray 1 application  1 application Topical PRN Cresenzo-Dishmon, Joaquim Lai, CNM      . bisacodyl (DULCOLAX) suppository 10 mg  10 mg Rectal Daily PRN Christin Fudge, CNM      . coconut oil  1 application Topical PRN Christin Fudge, CNM      . witch hazel-glycerin (TUCKS) pad 1 application  1 application Topical PRN Cresenzo-Dishmon, Joaquim Lai, CNM       And  . dibucaine (NUPERCAINAL) 1 % rectal ointment 1 application  1 application Rectal PRN Cresenzo-Dishmon, Joaquim Lai, CNM      . diphenhydrAMINE (BENADRYL) capsule 25 mg  25 mg Oral Q6H PRN Christin Fudge, CNM      .  docusate sodium (COLACE) capsule 100 mg  100 mg Oral BID PRN Aletha Halim, MD      . enalapril (VASOTEC) tablet 2.5 mg  2.5 mg Oral Daily Chancy Milroy, MD   2.5 mg at 08/13/17 1038  . ferrous sulfate tablet 325 mg  325 mg Oral BID WC Cresenzo-Dishmon, Joaquim Lai, CNM   325 mg at 08/13/17 2302  . ibuprofen (ADVIL,MOTRIN) tablet 600 mg  600 mg Oral Q6H PRN Aletha Halim, MD      . labetalol (NORMODYNE) tablet 800 mg  800 mg Oral TID Aletha Halim, MD      . measles, mumps and rubella vaccine (MMR) injection 0.5 mL  0.5 mL Subcutaneous Once Christin Fudge, CNM      . ondansetron (ZOFRAN) tablet 4 mg  4 mg Oral Q4H PRN Cresenzo-Dishmon, Joaquim Lai, CNM       Or  . ondansetron (ZOFRAN) injection 4 mg  4 mg Intravenous Q4H PRN Christin Fudge, CNM      . oxyCODONE (Oxy IR/ROXICODONE) immediate  release tablet 5 mg  5 mg Oral Q6H PRN Aletha Halim, MD      . prenatal multivitamin tablet 1 tablet  1 tablet Oral Daily Cresenzo-Dishmon, Joaquim Lai, CNM   1 tablet at 08/13/17 1038  . simethicone (MYLICON) chewable tablet 80 mg  80 mg Oral PRN Christin Fudge, CNM      . sodium phosphate (FLEET) 7-19 GM/118ML enema 1 enema  1 enema Rectal Daily PRN Christin Fudge, CNM      . Tdap (BOOSTRIX) injection 0.5 mL  0.5 mL Intramuscular Once Christin Fudge, CNM      . zolpidem (AMBIEN) tablet 5 mg  5 mg Oral QHS PRN Christin Fudge, CNM        Labs:  Recent Labs  Lab 08/08/17 1757 08/10/17 0236 08/13/17 0957  WBC 6.7 8.5 7.0  HGB 9.8* 9.6* 9.7*  HCT 29.2* 28.7* 29.4*  PLT 188 183 181    Recent Labs  Lab 08/08/17 1757 08/10/17 0236 08/13/17 0957  NA 134* 133* 136  K 3.7 4.1 3.4*  CL 106 104 102  CO2 20* 18* 25  BUN 10 10 6   CREATININE 0.39* 0.77 0.75  CALCIUM 8.6* 7.6* 7.2*  PROT 6.1* 7.2 6.3*  BILITOT 0.3 0.7 0.6  ALKPHOS 116 115 108  ALT 13 16 26   AST 23 28 31   GLUCOSE 80 152* 81     Radiology: no new imaging  Assessment & Plan:  Pt stable *PP: B POS. Routine care. Undecided on [facility] *CV: labetalol increased to 800 tid for 1700 dose today. *PPx: SCDs, OOB ad lib *FEN/GI: regular diet.  *Dispo: d/w her that would like to keep for today and possibly home tomorrow.   Durene Romans MD Attending Center for [facility])

## 2017-08-15 DIAGNOSIS — O1424 HELLP syndrome, complicating childbirth: Secondary | ICD-10-CM | POA: Diagnosis not present

## 2017-08-15 DIAGNOSIS — O09299 Supervision of pregnancy with other poor reproductive or obstetric history, unspecified trimester: Secondary | ICD-10-CM | POA: Diagnosis not present

## 2017-08-15 LAB — COMPREHENSIVE METABOLIC PANEL
ALK PHOS: 100 U/L (ref 38–126)
ALT: 44 U/L (ref 0–44)
ALT: 54 U/L — ABNORMAL HIGH (ref 0–44)
ALT: 64 U/L — AB (ref 0–44)
AST: 67 U/L — AB (ref 15–41)
AST: 82 U/L — ABNORMAL HIGH (ref 15–41)
AST: 101 U/L — AB (ref 15–41)
Albumin: 2.8 g/dL — ABNORMAL LOW (ref 3.5–5.0)
Albumin: 2.8 g/dL — ABNORMAL LOW (ref 3.5–5.0)
Albumin: 2.7 g/dL — ABNORMAL LOW (ref 3.5–5.0)
Alkaline Phosphatase: 99 U/L (ref 38–126)
Alkaline Phosphatase: 97 U/L (ref 38–126)
Anion gap: 9 (ref 5–15)
Anion gap: 9 (ref 5–15)
Anion gap: 10 (ref 5–15)
BILIRUBIN TOTAL: 0.5 mg/dL (ref 0.3–1.2)
BILIRUBIN TOTAL: 0.5 mg/dL (ref 0.3–1.2)
BUN: 10 mg/dL (ref 6–20)
BUN: 8 mg/dL (ref 6–20)
BUN: 9 mg/dL (ref 6–20)
CALCIUM: 8.2 mg/dL — AB (ref 8.9–10.3)
CHLORIDE: 107 mmol/L (ref 98–111)
CHLORIDE: 105 mmol/L (ref 98–111)
CO2: 22 mmol/L (ref 22–32)
CO2: 22 mmol/L (ref 22–32)
CO2: 21 mmol/L — ABNORMAL LOW (ref 22–32)
CREATININE: 0.64 mg/dL (ref 0.44–1.00)
Calcium: 8.5 mg/dL — ABNORMAL LOW (ref 8.9–10.3)
Calcium: 8.3 mg/dL — ABNORMAL LOW (ref 8.9–10.3)
Chloride: 106 mmol/L (ref 98–111)
Creatinine, Ser: 0.74 mg/dL (ref 0.44–1.00)
Creatinine, Ser: 0.78 mg/dL (ref 0.44–1.00)
GFR calc Af Amer: >60 mL/min (ref >60)
GFR calc non Af Amer: >60 mL/min (ref >60)
GLUCOSE: 120 mg/dL — AB (ref 70–99)
Glucose, Bld: 84 mg/dL (ref 70–99)
Glucose, Bld: 89 mg/dL (ref 70–99)
POTASSIUM: 4.1 mmol/L (ref 3.5–5.1)
POTASSIUM: 3.8 mmol/L (ref 3.5–5.1)
Potassium: 3.7 mmol/L (ref 3.5–5.1)
Sodium: 137 mmol/L (ref 135–145)
Sodium: 138 mmol/L (ref 135–145)
Sodium: 136 mmol/L (ref 135–145)
TOTAL PROTEIN: 5.9 g/dL — AB (ref 6.5–8.1)
TOTAL PROTEIN: 6.3 g/dL — AB (ref 6.5–8.1)
Total Bilirubin: 0.7 mg/dL (ref 0.3–1.2)
Total Protein: 6.5 g/dL (ref 6.5–8.1)

## 2017-08-15 LAB — CBC
HEMATOCRIT: 29.5 % — AB (ref 36.0–46.0)
HEMATOCRIT: 29.7 % — AB (ref 36.0–46.0)
HEMATOCRIT: 28.9 % — AB (ref 36.0–46.0)
HEMOGLOBIN: 9.6 g/dL — AB (ref 12.0–15.0)
Hemoglobin: 9.7 g/dL — ABNORMAL LOW (ref 12.0–15.0)
Hemoglobin: 9.5 g/dL — ABNORMAL LOW (ref 12.0–15.0)
MCH: 31.2 pg (ref 26.0–34.0)
MCH: 31.3 pg (ref 26.0–34.0)
MCH: 31.6 pg (ref 26.0–34.0)
MCHC: 32.5 g/dL (ref 30.0–36.0)
MCHC: 32.7 g/dL (ref 30.0–36.0)
MCHC: 32.9 g/dL (ref 30.0–36.0)
MCV: 95.8 fL (ref 78.0–100.0)
MCV: 95.8 fL (ref 78.0–100.0)
MCV: 96 fL (ref 78.0–100.0)
PLATELETS: 188 10*3/uL (ref 150–400)
PLATELETS: 198 10*3/uL (ref 150–400)
Platelets: 196 10*3/uL (ref 150–400)
RBC: 3.08 MIL/uL — AB (ref 3.87–5.11)
RBC: 3.1 MIL/uL — ABNORMAL LOW (ref 3.87–5.11)
RBC: 3.01 MIL/uL — AB (ref 3.87–5.11)
RDW: 12.9 % (ref 11.5–15.5)
RDW: 12.8 % (ref 11.5–15.5)
RDW: 12.9 % (ref 11.5–15.5)
WBC: 5.6 10*3/uL (ref 4.0–10.5)
WBC: 5.5 10*3/uL (ref 4.0–10.5)
WBC: 5.8 10*3/uL (ref 4.0–10.5)

## 2017-08-15 MED ORDER — MAGNESIUM SULFATE BOLUS VIA INFUSION
4.0000 g | Freq: Once | INTRAVENOUS | Status: DC
  Filled 2017-08-15: qty 500

## 2017-08-15 MED ORDER — HYDRALAZINE HCL 20 MG/ML IJ SOLN: 10.0000 mg | Freq: Once | INTRAMUSCULAR | Status: DC

## 2017-08-15 MED ORDER — MAGNESIUM SULFATE BOLUS VIA INFUSION
4.0000 g | Freq: Once | INTRAVENOUS | Status: AC
  Administered 2017-08-15: 4 g via INTRAVENOUS
  Filled 2017-08-15: qty 500

## 2017-08-15 MED ORDER — LABETALOL HCL 5 MG/ML IV SOLN: 20.0000 mg | INTRAVENOUS | Status: DC | PRN

## 2017-08-15 MED ORDER — NIFEDIPINE ER OSMOTIC RELEASE 30 MG PO TB24
30.0000 mg | ORAL_TABLET | Freq: Every day | ORAL | Status: DC
  Administered 2017-08-15 – 2017-08-17 (×3): 30 mg via ORAL
  Filled 2017-08-15 (×3): qty 1

## 2017-08-15 MED ORDER — HYDRALAZINE HCL 20 MG/ML IJ SOLN
10.0000 mg | Freq: Once | INTRAMUSCULAR | Status: AC | PRN
  Administered 2017-08-15: 10 mg via INTRAVENOUS
  Filled 2017-08-15 (×2): qty 1

## 2017-08-15 MED ORDER — HYDRALAZINE HCL 20 MG/ML IJ SOLN: 5.0000 mg | INTRAMUSCULAR | Status: DC | PRN

## 2017-08-15 MED ORDER — LACTATED RINGERS IV SOLN
INTRAVENOUS | Status: DC
  Administered 2017-08-15 – 2017-08-16 (×3): via INTRAVENOUS

## 2017-08-15 MED ORDER — ENALAPRIL MALEATE 10 MG PO TABS
10.0000 mg | ORAL_TABLET | Freq: Every day | ORAL | Status: DC
  Filled 2017-08-15: qty 1

## 2017-08-15 MED ORDER — MAGNESIUM SULFATE 40 G IN LACTATED RINGERS - SIMPLE
2.0000 g/h | INTRAVENOUS | Status: DC
  Administered 2017-08-16: 2 g/h via INTRAVENOUS
  Filled 2017-08-15: qty 40
  Filled 2017-08-15: qty 500

## 2017-08-15 MED ORDER — MAGNESIUM SULFATE 40 G IN LACTATED RINGERS - SIMPLE
2.0000 g/h | INTRAVENOUS | Status: DC
  Filled 2017-08-15: qty 500

## 2017-08-15 NOTE — Progress Notes (Signed)
Daily Antepartum Note  Admission Date: 08/08/2017 Current Date: 08/15/2017 10:34 AM  Evelyn Mcclain is a 22 y.o. G1P0101 HD#8/PPD#3/intact perineum @ 35/2, admitted for severe pre-eclampsia  Pregnancy complicated by: Patient Active Problem List   Diagnosis Date Noted  . Preeclampsia 08/08/2017  . UTI (urinary tract infection) during pregnancy, second trimester 05/12/2017  . Marijuana use 03/15/2017  . Supervision of normal first pregnancy, antepartum 03/14/2017  . Polysubstance abuse ([place]) 08/30/2011    Overnight/24hr events:  Elevated BPs this morning (see below)  Subjective:  No s/s of pre-eclampsia  Objective:    Current Vital Signs 24h Vital Sign Ranges  T 98.2 F (36.8 C) Temp  Avg: 98.7 F (37.1 C)  Min: 98.2 F (36.8 C)  Max: 99.5 F (37.5 C)  BP (!) 165/101 BP  Min: 127/82  Max: 186/99  HR 89 Pulse  Avg: 79.1  Min: 68  Max: 97  RR 18 Resp  Avg: 18.3  Min: 18  Max: 20  SaO2 100 % Room Air SpO2  Avg: 99.8 %  Min: 99 %  Max: 100 %       24 Hour I/O Current Shift I/O  Time Ins Outs No intake/output data recorded. No intake/output data recorded.   Patient Vitals for the past 24 hrs:  BP Temp Temp src Pulse Resp SpO2  08/15/17 1015 (!) 165/101 - - 89 - -  08/15/17 1000 (!) 174/103 - - 77 - -  08/15/17 0945 (!) 175/114 - - 87 - -  08/15/17 0930 (!) 182/103 - - 74 - -  08/15/17 0915 (!) 181/110 - - 82 - -  08/15/17 0913 (!) 170/109 - - 97 - -  08/15/17 0836 (!) 175/103 - - 84 - -  08/15/17 0804 (!) 170/109 - - 68 - -  08/15/17 0743 (!) 186/99 98.2 F (36.8 C) - 72 18 100 %  08/15/17 0742 (!) 186/99 - - 72 - -  08/15/17 0550 (!) 159/87 98.6 F (37 C) Oral 79 20 99 %  08/14/17 2327 127/82 98.2 F (36.8 C) Oral 82 18 100 %  08/14/17 1933 (!) 149/84 98.9 F (37.2 C) Oral 69 18 100 %  08/14/17 1706 (!) 150/95 98.5 F (36.9 C) Oral 78 18 100 %  08/14/17 1245 (!) 158/95 - - - - -  08/14/17 1228 (!) 166/96 99.5 F (37.5 C) Oral 76 18 100 %    Physical  exam: General: Well nourished, well developed female in no acute distress. Abdomen: nttp Cardiovascular: S1, S2 normal, no murmur, rub or gallop, regular rate and rhythm Respiratory: CTAB Extremities: no clubbing, cyanosis or edema Skin: Warm and dry.   Medications: Current Facility-Administered Medications  Medication Dose Route Frequency Provider Last Rate Last Dose  . acetaminophen (TYLENOL) tablet 650 mg  650 mg Oral Q4H PRN Christin Fudge, CNM      . benzocaine-Menthol (DERMOPLAST) 20-0.5 % topical spray 1 application  1 application Topical PRN Cresenzo-Dishmon, Joaquim Lai, CNM      . bisacodyl (DULCOLAX) suppository 10 mg  10 mg Rectal Daily PRN Christin Fudge, CNM      . coconut oil  1 application Topical PRN Christin Fudge, CNM      . witch hazel-glycerin (TUCKS) pad 1 application  1 application Topical PRN Cresenzo-Dishmon, Joaquim Lai, CNM       And  . dibucaine (NUPERCAINAL) 1 % rectal ointment 1 application  1 application Rectal PRN Cresenzo-Dishmon, Joaquim Lai, CNM      . diphenhydrAMINE (BENADRYL) capsule  25 mg  25 mg Oral Q6H PRN Christin Fudge, CNM      . docusate sodium (COLACE) capsule 100 mg  100 mg Oral BID PRN Aletha Halim, MD      . ferrous sulfate tablet 325 mg  325 mg Oral BID WC Cresenzo-Dishmon, Joaquim Lai, CNM   325 mg at 08/15/17 0743  . hydrALAZINE (APRESOLINE) injection 10 mg  10 mg Intravenous Once Aletha Halim, MD      . ibuprofen (ADVIL,MOTRIN) tablet 600 mg  600 mg Oral Q6H PRN Aletha Halim, MD   600 mg at 08/14/17 2328  . labetalol (NORMODYNE) tablet 800 mg  800 mg Oral TID Aletha Halim, MD   800 mg at 08/15/17 0742  . labetalol (NORMODYNE,TRANDATE) injection 20-80 mg  20-80 mg Intravenous Q10 min PRN Aletha Halim, MD      . lactated ringers infusion   Intravenous Continuous Aletha Halim, MD 10 mL/hr at 08/15/17 0957    . measles, mumps and rubella vaccine (MMR) injection 0.5 mL  0.5 mL Subcutaneous  Once Christin Fudge, CNM      . NIFEdipine (PROCARDIA-XL/ADALAT-CC/NIFEDICAL-XL) 24 hr tablet 30 mg  30 mg Oral Daily Aletha Halim, MD   30 mg at 08/15/17 0910  . ondansetron (ZOFRAN) tablet 4 mg  4 mg Oral Q4H PRN Cresenzo-Dishmon, Joaquim Lai, CNM       Or  . ondansetron (ZOFRAN) injection 4 mg  4 mg Intravenous Q4H PRN Christin Fudge, CNM      . oxyCODONE (Oxy IR/ROXICODONE) immediate release tablet 5 mg  5 mg Oral Q6H PRN Aletha Halim, MD      . prenatal multivitamin tablet 1 tablet  1 tablet Oral Daily Cresenzo-Dishmon, Joaquim Lai, CNM   1 tablet at 08/14/17 1043  . simethicone (MYLICON) chewable tablet 80 mg  80 mg Oral PRN Christin Fudge, CNM      . sodium phosphate (FLEET) 7-19 GM/118ML enema 1 enema  1 enema Rectal Daily PRN Christin Fudge, CNM      . Tdap (BOOSTRIX) injection 0.5 mL  0.5 mL Intramuscular Once Christin Fudge, CNM      . zolpidem (AMBIEN) tablet 5 mg  5 mg Oral QHS PRN Christin Fudge, CNM        Labs:  Recent Labs  Lab 08/10/17 0236 08/13/17 0957 08/15/17 0832  WBC 8.5 7.0 5.6  HGB 9.6* 9.7* 9.6*  HCT 28.7* 29.4* 29.5*  PLT 183 181 196    Recent Labs  Lab 08/10/17 0236 08/13/17 0957 08/15/17 0832  NA 133* 136 137  K 4.1 3.4* 3.7  CL 104 102 106  CO2 18* 25 22  BUN 10 6 10   CREATININE 0.77 0.75 0.64  CALCIUM 7.6* 7.2* 8.2*  PROT 7.2 6.3* 6.5  BILITOT 0.7 0.6 0.5  ALKPHOS 115 108 100  ALT 16 26 44  AST 28 31 67*  GLUCOSE 152* 81 84     Radiology: no new imaging  Assessment & Plan:  Pt stable *PP: B POS. Routine care. Undecided on [facility] *CV: pt already on max dose labetalol and low dose vasotec. IV came out so new one placed and repeat labs drawn. Will continue with labetalol but will d/c vasotec and start procardia xl 85m qday. IV hydralazine given and BP came down to 165/101 and will give another dose. CBC normal but AST up a little. Will repeat labs in early afternoon. If labs  become truly abnormal and/or pt develops neuro s/s will re mg patient.  *PPx: SCDs, OOB ad  lib *FEN/GI: regular diet.  *Dispo: pending better BP control.   Durene Romans MD Attending Center for [facility])

## 2017-08-15 NOTE — Lactation Note (Signed)
This note was copied from a baby's chart. Lactation Consultation Note  Patient Name: Evelyn Mcclain Today's Date: 08/15/2017 Reason for consult: NICU baby  I had checked on Mom to see how her breasts were doing. Mom said they her breasts were, "OK," but she was quietly crying and I could tell that she did not want to talk further.  Lurline HareRichey, Zylon Creamer [facility] 08/15/2017, 2:29 PM

## 2017-08-15 NOTE — Progress Notes (Signed)
AP note Patient now with PP atypical hellp based on labwork. BPs came down well with the procardia xl and IV hydral (only needed one 10mg  dose). Will start Mg and rpt blood work in approx 6 hours  Larue Bingharlie Beuna Bolding, Montez HagemanJr MD Attending Center for [facility] (Faculty Practice) 08/15/2017 Time: [phone]

## 2017-08-15 NOTE — Progress Notes (Signed)
Reviewed patient's recent labwork, slightly up AST/ALT. H/H stable, platelets stable, Creatinine stable. BP improved this afternoon.   Cont on MgSO4, will recheck labs in early am.    K. Therese SarahMeryl Lamarco Gudiel, M.D. Center for [facility] 08/15/17 7:20 PM

## 2017-08-16 LAB — CBC
HCT: 28.5 % — ABNORMAL LOW (ref 36.0–46.0)
HEMATOCRIT: 32 % — AB (ref 36.0–46.0)
Hemoglobin: 9.3 g/dL — ABNORMAL LOW (ref 12.0–15.0)
Hemoglobin: 10.4 g/dL — ABNORMAL LOW (ref 12.0–15.0)
MCH: 31 pg (ref 26.0–34.0)
MCH: 31 pg (ref 26.0–34.0)
MCHC: 32.6 g/dL (ref 30.0–36.0)
MCHC: 32.5 g/dL (ref 30.0–36.0)
MCV: 95 fL (ref 78.0–100.0)
MCV: 95.5 fL (ref 78.0–100.0)
PLATELETS: 200 10*3/uL (ref 150–400)
Platelets: 251 10*3/uL (ref 150–400)
RBC: 3 MIL/uL — AB (ref 3.87–5.11)
RBC: 3.35 MIL/uL — AB (ref 3.87–5.11)
RDW: 13 % (ref 11.5–15.5)
RDW: 12.9 % (ref 11.5–15.5)
WBC: 5 10*3/uL (ref 4.0–10.5)
WBC: 5.6 10*3/uL (ref 4.0–10.5)

## 2017-08-16 LAB — COMPREHENSIVE METABOLIC PANEL
ALT: 72 U/L — ABNORMAL HIGH (ref 0–44)
ALT: 76 U/L — ABNORMAL HIGH (ref 0–44)
AST: 94 U/L — AB (ref 15–41)
AST: 92 U/L — AB (ref 15–41)
Albumin: 2.9 g/dL — ABNORMAL LOW (ref 3.5–5.0)
Albumin: 3.1 g/dL — ABNORMAL LOW (ref 3.5–5.0)
Alkaline Phosphatase: 97 U/L (ref 38–126)
Alkaline Phosphatase: 106 U/L (ref 38–126)
Anion gap: 8 (ref 5–15)
Anion gap: 10 (ref 5–15)
BILIRUBIN TOTAL: 0.5 mg/dL (ref 0.3–1.2)
BUN: 7 mg/dL (ref 6–20)
BUN: 7 mg/dL (ref 6–20)
CALCIUM: 7.8 mg/dL — AB (ref 8.9–10.3)
CHLORIDE: 104 mmol/L (ref 98–111)
CO2: 22 mmol/L (ref 22–32)
CO2: 22 mmol/L (ref 22–32)
Calcium: 7.6 mg/dL — ABNORMAL LOW (ref 8.9–10.3)
Chloride: 103 mmol/L (ref 98–111)
Creatinine, Ser: 0.67 mg/dL (ref 0.44–1.00)
Creatinine, Ser: 0.77 mg/dL (ref 0.44–1.00)
GFR calc Af Amer: >60 mL/min (ref >60)
Glucose, Bld: 95 mg/dL (ref 70–99)
Glucose, Bld: 104 mg/dL — ABNORMAL HIGH (ref 70–99)
POTASSIUM: 3.6 mmol/L (ref 3.5–5.1)
Potassium: 4 mmol/L (ref 3.5–5.1)
SODIUM: 134 mmol/L — AB (ref 135–145)
Sodium: 135 mmol/L (ref 135–145)
TOTAL PROTEIN: 7 g/dL (ref 6.5–8.1)
Total Bilirubin: 0.4 mg/dL (ref 0.3–1.2)
Total Protein: 6.6 g/dL (ref 6.5–8.1)

## 2017-08-16 NOTE — Progress Notes (Addendum)
Daily Postpartum Note  Admission Date: 08/08/2017 Current Date: 08/16/2017 9:47 AM  Evelyn Mcclain is a 22 y.o. G1P0101 HD#9/PPD#4/intact perineum @ 79/2, admitted for severe pre-eclampsia and developed PP HELLP on PPD#3  Pregnancy complicated by: Patient Active Problem List   Diagnosis Date Noted  . HELLP syndrome, delivered, current hospitalization 08/15/2017  . Preeclampsia 08/08/2017  . UTI (urinary tract infection) during pregnancy, second trimester 05/12/2017  . Marijuana use 03/15/2017  . Supervision of normal first pregnancy, antepartum 03/14/2017  . Polysubstance abuse ([place]) 08/30/2011    Overnight/24hr events:  none  Subjective:  No s/s of pre-eclampsia or Mg toxicity  Objective:    Current Vital Signs 24h Vital Sign Ranges  T 98.6 F (37 C) Temp  Avg: 98.5 F (36.9 C)  Min: 98 F (36.7 C)  Max: 99.1 F (37.3 C)  BP 130/78 BP  Min: 115/62  Max: 174/103  HR 77 Pulse  Avg: 84.6  Min: 77  Max: 98  RR 15 Resp  Avg: 16.8  Min: 15  Max: 18  SaO2 99 % Room Air SpO2  Avg: 99.5 %  Min: 98 %  Max: 100 %       24 Hour I/O Current Shift I/O  Time Ins Outs 07/22 0701 - 07/23 0700 In: 1898.7 [P.O.:240; I.V.:1658.7] Out: 5550 [Urine:5550] 07/23 0701 - 07/23 1900 In: 220 [P.O.:220] Out: 1700 [Urine:1700]   UOP: >172m/hr  Patient Vitals for the past 24 hrs:  BP Temp Temp src Pulse Resp SpO2  08/16/17 0756 130/78 98.6 F (37 C) Oral 77 15 99 %  08/16/17 0555 137/81 98 F (36.7 C) - 80 18 100 %  08/16/17 0205 129/72 - - 83 - -  08/15/17 2200 130/78 - - 83 16 -  08/15/17 2100 119/72 - - 86 17 100 %  08/15/17 2000 116/61 98.3 F (36.8 C) Oral 83 17 100 %  08/15/17 1900 134/68 - - 77 - -  08/15/17 1804 135/83 - - 86 - -  08/15/17 1800 - - - - 16 -  08/15/17 1700 115/62 - - 78 - -  08/15/17 1655 119/68 - - 81 16 -  08/15/17 1600 119/68 - - 81 - -  08/15/17 1548 - - - - 16 -  08/15/17 1543 120/78 98.5 F (36.9 C) Oral 89 18 100 %  08/15/17 1500 127/71 - - 89 - -   08/15/17 1444 129/67 - - 98 - -  08/15/17 1440 - - - - 16 98 %  08/15/17 1436 - 99.1 F (37.3 C) - 88 - -  08/15/17 1430 (!) 146/80 - - 91 18 -  08/15/17 1424 (!) 147/80 - - 84 - -  08/15/17 1420 - - - - 18 -  08/15/17 1400 131/68 - - 89 - -  08/15/17 1330 (!) 151/82 - - 87 - -  08/15/17 1300 (!) 147/90 - - 92 - -  08/15/17 1230 (!) 143/77 - - 88 - -  08/15/17 1200 (!) 153/82 - - 91 - -  08/15/17 1145 (!) 157/90 - - 87 - -  08/15/17 1130 (!) 151/92 - - 86 - -  08/15/17 1115 (!) 156/85 - - 77 - -  08/15/17 1100 (!) 152/89 - - 77 - -  08/15/17 1045 (!) 155/92 - - 77 - -  08/15/17 1030 (!) 158/102 - - 86 - -  08/15/17 1015 (!) 165/101 - - 89 - -  08/15/17 1000 (!) 174/103 - - 77 - -  Physical exam: General: Well nourished, well developed female in no acute distress. Abdomen: nttp Cardiovascular: S1, S2 normal, no murmur, rub or gallop, regular rate and rhythm Respiratory: CTAB Extremities: no clubbing, cyanosis or edema Skin: Warm and dry.   Medications: Current Facility-Administered Medications  Medication Dose Route Frequency Provider Last Rate Last Dose  . acetaminophen (TYLENOL) tablet 650 mg  650 mg Oral Q4H PRN Christin Fudge, CNM   650 mg at 08/15/17 2254  . benzocaine-Menthol (DERMOPLAST) 20-0.5 % topical spray 1 application  1 application Topical PRN Cresenzo-Dishmon, Joaquim Lai, CNM      . bisacodyl (DULCOLAX) suppository 10 mg  10 mg Rectal Daily PRN Christin Fudge, CNM      . coconut oil  1 application Topical PRN Christin Fudge, CNM      . witch hazel-glycerin (TUCKS) pad 1 application  1 application Topical PRN Cresenzo-Dishmon, Joaquim Lai, CNM       And  . dibucaine (NUPERCAINAL) 1 % rectal ointment 1 application  1 application Rectal PRN Cresenzo-Dishmon, Joaquim Lai, CNM      . diphenhydrAMINE (BENADRYL) capsule 25 mg  25 mg Oral Q6H PRN Christin Fudge, CNM      . docusate sodium (COLACE) capsule 100 mg  100 mg Oral BID PRN  Aletha Halim, MD      . ferrous sulfate tablet 325 mg  325 mg Oral BID WC Christin Fudge, CNM   325 mg at 08/16/17 0807  . hydrALAZINE (APRESOLINE) injection 5-10 mg  5-10 mg Intravenous Q20 Min PRN Aletha Halim, MD      . ibuprofen (ADVIL,MOTRIN) tablet 600 mg  600 mg Oral Q6H PRN Aletha Halim, MD   600 mg at 08/14/17 2328  . labetalol (NORMODYNE) tablet 800 mg  800 mg Oral TID Aletha Halim, MD   800 mg at 08/16/17 0629  . labetalol (NORMODYNE,TRANDATE) injection 20-40 mg  20-40 mg Intravenous Q10 min PRN Aletha Halim, MD      . labetalol (NORMODYNE,TRANDATE) injection 20-80 mg  20-80 mg Intravenous Q10 min PRN Aletha Halim, MD      . lactated ringers infusion   Intravenous Continuous Aletha Halim, MD 75 mL/hr at 08/16/17 0600    . magnesium sulfate 40 grams in LR 500 mL OB infusion  2 g/hr Intravenous Continuous Aletha Halim, MD 25 mL/hr at 08/16/17 0827 2 g/hr at 08/16/17 0827  . measles, mumps and rubella vaccine (MMR) injection 0.5 mL  0.5 mL Subcutaneous Once Christin Fudge, CNM      . NIFEdipine (PROCARDIA-XL/ADALAT-CC/NIFEDICAL-XL) 24 hr tablet 30 mg  30 mg Oral Daily Aletha Halim, MD   30 mg at 08/15/17 0910  . ondansetron (ZOFRAN) tablet 4 mg  4 mg Oral Q4H PRN Cresenzo-Dishmon, Joaquim Lai, CNM       Or  . ondansetron (ZOFRAN) injection 4 mg  4 mg Intravenous Q4H PRN Christin Fudge, CNM      . oxyCODONE (Oxy IR/ROXICODONE) immediate release tablet 5 mg  5 mg Oral Q6H PRN Aletha Halim, MD   5 mg at 08/16/17 0207  . prenatal multivitamin tablet 1 tablet  1 tablet Oral Daily Cresenzo-Dishmon, Joaquim Lai, CNM   1 tablet at 08/15/17 1158  . simethicone (MYLICON) chewable tablet 80 mg  80 mg Oral PRN Christin Fudge, CNM      . sodium phosphate (FLEET) 7-19 GM/118ML enema 1 enema  1 enema Rectal Daily PRN Christin Fudge, CNM      . Tdap (BOOSTRIX) injection 0.5 mL  0.5 mL Intramuscular Once Christin Fudge, CNM      .  zolpidem (AMBIEN) tablet 5 mg  5 mg Oral QHS PRN Christin Fudge, CNM        Labs:  Recent Labs  Lab 08/15/17 1309 08/15/17 1800 08/16/17 0540  WBC 5.5 5.8 5.0  HGB 9.7* 9.5* 9.3*  HCT 29.7* 28.9* 28.5*  PLT 188 198 200    Recent Labs  Lab 08/15/17 1309 08/15/17 1800 08/16/17 0540  NA 138 136 134*  K 4.1 3.8 3.6  CL 107 105 104  CO2 22 21* 22  BUN 8 9 7   CREATININE 0.74 0.78 0.67  CALCIUM 8.5* 8.3* 7.6*  PROT 5.9* 6.3* 6.6  BILITOT 0.7 0.5 0.4  ALKPHOS 99 97 97  ALT 54* 64* 72*  AST 82* 101* 94*  GLUCOSE 89 120* 95     Radiology: no new imaging  Assessment & Plan:  Pt stable *PP: B POS. Routine care. Undecided on [facility] *CV: BPs better on labetalol and procardia. F/u labs later today and continuing to improve can come off Mg at 24hrs *PPx: SCDs, OOB ad lib *FEN/GI: regular diet.  *Dispo: pending better BP control and improving labs. Hopefully tomorrow  Durene Romans MD Attending Center for [facility])

## 2017-08-16 NOTE — Lactation Note (Addendum)
This note was copied from a baby's chart. Lactation Consultation Note  Patient Name: Evelyn Mcclain Today's Date: 08/16/2017    Baby 394 days old in NICU.  Mother on 31MagSO4.  Mother open to reviewing hand expression with good flow of transitional breastmilk. Provided mother w/ breastmilk labels, reviewed cleaning pump parts and assisted mother w/ pumping. Discussed bringing breastmilk within 4 hours to NICU and pumping q 2.5-3 hours. Labeled approx 6 ml of breastmilk for mother to take to NICU next times she visits and suggest pumping after visit.  [facility] for loaner pump and [facility]     Maternal Data    Feeding Feeding Type: Formula Nipple Type: Slow - flow Length of feed: 30 min  LATCH Score                   Interventions    Lactation Tools Discussed/Used     Consult Status      Hardie PulleyBerkelhammer, Leelyn Jasinski Boschen 08/16/2017, 11:18 AM

## 2017-08-17 ENCOUNTER — Encounter: Payer: Self-pay | Admitting: Family Medicine

## 2017-08-17 LAB — COMPREHENSIVE METABOLIC PANEL
ALK PHOS: 95 U/L (ref 38–126)
ALT: 61 U/L — ABNORMAL HIGH (ref 0–44)
ANION GAP: 7 (ref 5–15)
AST: 59 U/L — ABNORMAL HIGH (ref 15–41)
Albumin: 3.1 g/dL — ABNORMAL LOW (ref 3.5–5.0)
BILIRUBIN TOTAL: 0.2 mg/dL — AB (ref 0.3–1.2)
BUN: 10 mg/dL (ref 6–20)
CALCIUM: 8.4 mg/dL — AB (ref 8.9–10.3)
CO2: 23 mmol/L (ref 22–32)
Chloride: 106 mmol/L (ref 98–111)
Creatinine, Ser: 0.71 mg/dL (ref 0.44–1.00)
Glucose, Bld: 81 mg/dL (ref 70–99)
POTASSIUM: 4 mmol/L (ref 3.5–5.1)
Sodium: 136 mmol/L (ref 135–145)
TOTAL PROTEIN: 6.7 g/dL (ref 6.5–8.1)

## 2017-08-17 LAB — CBC
HEMATOCRIT: 30.7 % — AB (ref 36.0–46.0)
HEMOGLOBIN: 9.8 g/dL — AB (ref 12.0–15.0)
MCH: 30.4 pg (ref 26.0–34.0)
MCHC: 31.9 g/dL (ref 30.0–36.0)
MCV: 95.3 fL (ref 78.0–100.0)
Platelets: 229 10*3/uL (ref 150–400)
RBC: 3.22 MIL/uL — ABNORMAL LOW (ref 3.87–5.11)
RDW: 12.9 % (ref 11.5–15.5)
WBC: 5.3 10*3/uL (ref 4.0–10.5)

## 2017-08-17 MED ORDER — LABETALOL HCL 200 MG PO TABS: 800.0000 mg | ORAL_TABLET | Freq: Three times a day (TID) | ORAL | 1 refills | Status: DC

## 2017-08-17 MED ORDER — NIFEDIPINE ER 30 MG PO TB24: 30.0000 mg | ORAL_TABLET | Freq: Every day | ORAL | 1 refills | Status: DC

## 2017-08-17 NOTE — Discharge Summary (Signed)
Physician Obstetric Discharge Summary  Patient ID: Evelyn Mcclain MRN: [id number] DOB/AGE: 23-31-1997 23 y.o.   Prenatal Clinic: [facility]  Date of Admission: 08/08/2017  Date of Discharge: 08/17/2017  Admitting Diagnosis: Pregnancy at 33/5 weeks with pre-eclampsia with severe features based on blood pressures.   Secondary Diagnosis: History of THC use, limited prenatal care (last visit May 12, 2017)  Discharge Diagnosis: Delivered. PP atypical HELLP  Date of Delivery: 08/12/2017  Delivered by: Evelyn Mcclain CNM  Mode of Delivery: Vaginal delivery, intact perineum (EBL )  Anesthesia: epidural  Postpartum procedures: none  Complications: no complications with delivery  Brief Hospital Course  Patient had an uncomplicated IOL for which she was on magnesium. She had an uncomplicated delivery and received 24h of PP Mg. During this time, her blood pressures were difficult to control, with control eventually established with max dose labetalol and procardia XL. On PPD#3 she developed atypical HELLP based on AST/ALT elevations when these labs were rechecked due to spikes in her blood pressures. She received another 24 hours of PP Mg. On the day of discharge, her BPs were under control and she continued to be asymptomatic with improving labs  Labs: CBC Latest Ref Rng & Units 08/17/2017 08/16/2017 08/16/2017  WBC 4.0 - 10.5 K/uL 5.3 5.6 5.0  Hemoglobin 12.0 - 15.0 g/dL 7.8(G) 10.4(L) 9.3(L)  Hematocrit 36.0 - 46.0 % 30.7(L) 32.0(L) 28.5(L)  Platelets 150 - 400 K/uL 229 251 200   CMP Latest Ref Rng & Units 08/17/2017 08/16/2017 08/16/2017  Glucose 70 - 99 mg/dL 81 956(O) 95  BUN 6 - 20 mg/dL 10 7 7   Creatinine 0.44 - 1.00 mg/dL 1.30 8.65 7.84  Sodium 135 - 145 mmol/L 136 135 134(L)  Potassium 3.5 - 5.1 mmol/L 4.0 4.0 3.6  Chloride 98 - 111 mmol/L 106 103 104  CO2 22 - 32 mmol/L 23 22 22   Calcium 8.9 - 10.3 mg/dL 6.9(G) 7.8(L) 7.6(L)  Total Protein 6.5 - 8.1 g/dL 6.7 7.0  6.6  Total Bilirubin 0.3 - 1.2 mg/dL 2.9(B) 0.5 0.4  Alkaline Phos 38 - 126 U/L 95 106 97  AST 15 - 41 U/L 59(H) 92(H) 94(H)  ALT 0 - 44 U/L 61(H) 76(H) 72(H)   --/--/B POS (07/15 1757)  Physical exam:   Current Vital Signs 24h Vital Sign Ranges  T 98.5 F (36.9 C) Temp  Avg: 98.5 F (36.9 C)  Min: 98 F (36.7 C)  Max: 98.8 F (37.1 C)  BP 121/74 BP  Min: 118/75  Max: 145/91  HR 82 Pulse  Avg: 80.8  Min: 75  Max: 86  RR 18 Resp  Avg: 16.2  Min: 15  Max: 18  SaO2 100 % Room Air SpO2  Avg: 100 %  Min: 100 %  Max: 100 %       24 Hour I/O Current Shift I/O  Time Ins Outs 07/23 0701 - 07/24 0700 In: 2831.3 [P.O.:2040; I.V.:791.3] Out: 3800 [Urine:3800] No intake/output data recorded.   NAD Perineum: deferred Abdomen: firm fundus below the umbilicus, NTTP, non distended, +bowel sounds.  RRR no MRGs CTAB Ext: no c/c/e  Discharge Instructions: Per After Visit Summary. Activity: Advance as tolerated. Pelvic rest for 6 weeks.  Also refer to After Visit Summary Diet: Regular Postpartum contraception: Patient desires Depo Provera Discharge Medications:  Allergies as of 08/17/2017      Reactions   Septra [bactrim] Rash      Medication List    STOP taking these medications   ampicillin  500 MG capsule Commonly known as:  PRINCIPEN   metroNIDAZOLE 500 MG tablet Commonly known as:  FLAGYL   PRENATAL GUMMIES/DHA & FA 0.4-32.5 MG Chew     TAKE these medications   ibuprofen 800 MG tablet Commonly known as:  ADVIL,MOTRIN Take 1 tablet (800 mg total) by mouth every 6 (six) hours as needed.   labetalol 200 MG tablet Commonly known as:  NORMODYNE Take 4 tablets (800 mg total) by mouth 3 (three) times daily.   NIFEdipine 30 MG 24 hr tablet Commonly known as:  PROCARDIA-XL/ADALAT CC Take 1 tablet (30 mg total) by mouth daily.   Prenatal Vitamins 0.8 MG tablet Take 1 tablet by mouth daily.   promethazine 12.5 MG tablet Commonly known as:  PHENERGAN Take 1 tablet (12.5 mg  total) by mouth every 6 (six) hours as needed for nausea or vomiting.       Discharged Condition: Stable Discharged to: Home Outpatient follow up: Request sent for 1wk BP check and depo provera shot  Newborn Data: APGAR (1 MIN): 6   APGAR (5 MINS): 8   APGAR (10 MINS):   Baby Weight: 1740gm Baby Feeding: formula Disposition:NICU   Evelyn Copaharlie Jatoria Mcclain, Jr MD Attending Center for [facility](Faculty Practice)

## 2017-08-17 NOTE — Progress Notes (Signed)
Ambulated out teaching complete  

## 2017-08-18 ENCOUNTER — Ambulatory Visit (INDEPENDENT_AMBULATORY_CARE_PROVIDER_SITE_OTHER): Payer: Medicaid Other | Admitting: General Practice

## 2017-08-18 ENCOUNTER — Encounter: Payer: Self-pay | Admitting: General Practice

## 2017-08-18 VITALS — BP 120/69 | HR 82 | Wt 153.0 lb

## 2017-08-18 DIAGNOSIS — Z0130 Encounter for examination of blood pressure without abnormal findings: Secondary | ICD-10-CM

## 2017-08-18 NOTE — Progress Notes (Addendum)
Patient presents to office today for BP check. Patient reports taking blood pressure medications daily. Patient denies headaches, dizziness or blurry vision. +1 Edema noted in feet/ankles. Discussed BP with Dr Erin FullingHarraway Smith who states patient can reduce to labetalol 800mg  BID and continue procardia- patient should follow up in 1 week for repeat BP check. Discussed with patient. Told patient if she develops headaches, dizziness, or blurry vision to call us or return to [facility]. Patient verbalized understanding & had no questions.  Attestation of Attending Supervision of RN: Evaluation and management procedures were performed by the nurse under my supervision and collaboration.  I have reviewed the nursing note and chart, and I agree with the management and plan.  Carolyn L. Harraway-Smith, M.D., [facility]

## 2017-08-19 ENCOUNTER — Ambulatory Visit: Payer: Medicaid Other

## 2017-08-23 ENCOUNTER — Encounter: Payer: Medicaid Other | Admitting: Medical

## 2017-08-25 ENCOUNTER — Encounter: Payer: Self-pay | Admitting: Family Medicine

## 2017-08-30 ENCOUNTER — Encounter: Payer: Medicaid Other | Admitting: Student

## 2017-09-06 ENCOUNTER — Encounter: Payer: Medicaid Other | Admitting: Student

## 2017-09-13 ENCOUNTER — Encounter: Payer: Medicaid Other | Admitting: Student

## 2017-09-14 ENCOUNTER — Ambulatory Visit: Payer: Medicaid Other | Admitting: Advanced Practice Midwife

## 2017-09-20 ENCOUNTER — Encounter: Payer: Medicaid Other | Admitting: Medical

## 2019-01-06 ENCOUNTER — Encounter (HOSPITAL_COMMUNITY): Payer: Self-pay | Admitting: Emergency Medicine

## 2019-01-06 ENCOUNTER — Other Ambulatory Visit: Payer: Self-pay

## 2019-01-06 ENCOUNTER — Ambulatory Visit (HOSPITAL_COMMUNITY)
Admission: EM | Admit: 2019-01-06 | Discharge: 2019-01-06 | Disposition: A | Discharge status: Home / Self Care | Payer: Medicaid Other | Attending: Emergency Medicine | Admitting: Emergency Medicine

## 2019-01-06 DIAGNOSIS — B9689 Other specified bacterial agents as the cause of diseases classified elsewhere: Secondary | ICD-10-CM

## 2019-01-06 DIAGNOSIS — K0889 Other specified disorders of teeth and supporting structures: Secondary | ICD-10-CM | POA: Diagnosis not present

## 2019-01-06 DIAGNOSIS — N760 Acute vaginitis: Secondary | ICD-10-CM

## 2019-01-06 MED ORDER — METRONIDAZOLE 500 MG PO TABS: 500.0000 mg | ORAL_TABLET | Freq: Two times a day (BID) | ORAL | 0 refills | Status: AC

## 2019-01-06 MED ORDER — AMOXICILLIN 500 MG PO CAPS: 500.0000 mg | ORAL_CAPSULE | Freq: Two times a day (BID) | ORAL | 0 refills | Status: AC

## 2019-01-06 MED ORDER — IBUPROFEN 800 MG PO TABS
800.0000 mg | ORAL_TABLET | Freq: Three times a day (TID) | ORAL | 0 refills | Status: DC
Start: 2019-01-06 — End: 2019-04-08

## 2019-01-06 MED ORDER — HYDROCODONE-ACETAMINOPHEN 5-325 MG PO TABS: 1.0000 | ORAL_TABLET | Freq: Four times a day (QID) | ORAL | 0 refills | Status: DC | PRN

## 2019-01-06 NOTE — ED Triage Notes (Addendum)
Pt states something in her mouth is hurting really bad on the L upper side, pt states pain is up in the upper part of her mouth near her cheek. x2 days. Pt also states she had BV two months ago and was never able to pick up the medicine. Requesting the medicine for it, states she doesn't want to be tested.

## 2019-01-06 NOTE — ED Provider Notes (Signed)
[facility]    CSN: [id number] Arrival date & time: 01/06/19  1004      History   Chief Complaint No chief complaint on file. Dental pain  HPI Evelyn Mcclain is a 23 y.o. female no contributing past medical history presenting today for evaluation of mouth pain.  Patient states that she has had pain around one of her teeth in her left upper jaw.  Symptoms began approximately 2 days ago.  She is having a lot of pain especially with eating.  She has been using cotton balls as well as taking Tylenol and ibuprofen without relief of pain.  She is unsure of her wisdom teeth upcoming.  She denies any swelling.  Denies fevers.  Denies neck stiffness.  Denies ear pain.  Also requesting prescription for Flagyl to treat bacterial vaginosis.  She was seen here previously and tested positive for this.  She did not pick up the prescription, but is still having symptoms.   HPI  Past Medical History:  Diagnosis Date  . Anemia   . Chlamydia   . Heart murmur   . Hx of seasonal allergies   . Infection    uti  . Pregnancy induced hypertension   . Subarachnoid hemorrhage following injury (HCC) 08/30/2011  . Subdural hematoma, 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/2011    Patient Active Problem List   Diagnosis Date Noted  . HELLP syndrome, delivered, current hospitalization 08/15/2017  . Preeclampsia 08/08/2017  . UTI (urinary tract infection) during pregnancy, second trimester 05/12/2017  . Marijuana use 03/15/2017  . Supervision of normal first pregnancy, antepartum 03/14/2017  . Polysubstance abuse (HCC) 08/30/2011    Past Surgical History:  Procedure Laterality Date  . NO PAST SURGERIES      OB History    Gravida  1   Para  1   Term      Preterm  1   AB  0   Living  1     SAB  0   TAB      Ectopic      Multiple  0   Live Births  1            Home Medications    Prior to Admission medications   Medication Sig Start Date End Date Taking? Authorizing  Provider  amoxicillin (AMOXIL) 500 MG capsule Take 1 capsule (500 mg total) by mouth 2 (two) times daily for 7 days. 01/06/19 01/13/19  Jarmon Javid C, PA-C  HYDROcodone-acetaminophen (NORCO/VICODIN) 5-325 MG tablet Take 1 tablet by mouth every 6 (six) hours as needed for severe pain. 01/06/19   Legacie Dillingham C, PA-C  ibuprofen (ADVIL) 800 MG tablet Take 1 tablet (800 mg total) by mouth 3 (three) times daily. 01/06/19   Grant Swager C, PA-C  metroNIDAZOLE (FLAGYL) 500 MG tablet Take 1 tablet (500 mg total) by mouth 2 (two) times daily for 7 days. 01/06/19 01/13/19  Carr Shartzer C, PA-C  labetalol (NORMODYNE) 200 MG tablet Take 4 tablets (800 mg total) by mouth 3 (three) times daily. Patient not taking: Reported on 01/06/2019 08/17/17 01/06/19  [place] BingPickens, Charlie, MD  NIFEdipine (PROCARDIA-XL/ADALAT CC) 30 MG 24 hr tablet Take 1 tablet (30 mg total) by mouth daily. Patient not taking: Reported on 01/06/2019 08/17/17 01/06/19  [place] BingPickens, Charlie, MD  promethazine (PHENERGAN) 12.5 MG tablet Take 1 tablet (12.5 mg total) by mouth every 6 (six) hours as needed for nausea or vomiting. Patient not taking: Reported on 03/14/2017 02/13/17 01/06/19  Judeth HornLawrence, Erin, NP  Family History Family History  Problem Relation Age of Onset  . Hypertension Mother   . Hypertension Maternal Aunt   . Hypertension Maternal Uncle   . Hypertension Maternal Grandmother   . Asthma Maternal Grandmother   . Hypertension Maternal Grandfather   . Diabetes Paternal Grandmother   . Diabetes Paternal Grandfather   . Asthma Brother     Social History Social History   Tobacco Use  . Smoking status: Current Some Day Smoker    Packs/day: 0.00    Types: Cigarettes  . Smokeless tobacco: Never Used  Substance Use Topics  . Alcohol use: No  . Drug use: Not Currently    Types: Marijuana    Comment: June 2019     Allergies   Septra [bactrim]   Review of Systems Review of Systems  Constitutional: Negative for  activity change, appetite change, chills, fatigue and fever.  HENT: Positive for dental problem. Negative for congestion, ear pain, rhinorrhea, sinus pressure, sore throat and trouble swallowing.   Eyes: Negative for discharge and redness.  Respiratory: Negative for cough, chest tightness and shortness of breath.   Cardiovascular: Negative for chest pain.  Gastrointestinal: Negative for abdominal pain, diarrhea, nausea and vomiting.  Musculoskeletal: Negative for myalgias.  Skin: Negative for rash.  Neurological: Negative for dizziness, light-headedness and headaches.     Physical Exam Triage Vital Signs ED Triage Vitals  Enc Vitals Group     BP 01/06/19 1020 (!) 145/86     Pulse Rate 01/06/19 1020 81     Resp 01/06/19 1020 16     Temp 01/06/19 1020 98.7 F (37.1 C)     Temp src --      SpO2 01/06/19 1020 99 %     Weight --      Height --      Head Circumference --      Peak Flow --      Pain Score 01/06/19 1022 7     Pain Loc --      Pain Edu? --      Excl. in [place]? --    No data found.  Updated Vital Signs BP (!) 145/86   Pulse 81   Temp 98.7 F (37.1 C)   Resp 16   LMP 12/13/2018   SpO2 99%   Visual Acuity Right Eye Distance:   Left Eye Distance:   Bilateral Distance:    Right Eye Near:   Left Eye Near:    Bilateral Near:     Physical Exam Vitals and nursing note reviewed.  Constitutional:      Appearance: She is well-developed.     Comments: No acute distress  HENT:     Head: Normocephalic and atraumatic.     Ears:     Comments: Bilateral ears without tenderness to palpation of external auricle, tragus and mastoid, EAC's without erythema or swelling, TM's with good bony landmarks and cone of light. Non erythematous.     Nose: Nose normal.     Mouth/Throat:     Comments: Oral mucosa pink and moist, no tonsillar enlargement or exudate. Posterior pharynx patent and nonerythematous, no uvula deviation or swelling. Normal phonation. No soft palate  swelling, no lesions noted to oral mucosa, patient points to area above posterior molar on left upper jaw as source of pain, gingiva appears mildly erythematous, tender to touch, no obvious fracture or deformity tooth noted. Eyes:     Conjunctiva/sclera: Conjunctivae normal.  Cardiovascular:     Rate and  Rhythm: Normal rate.  Pulmonary:     Effort: Pulmonary effort is normal. No respiratory distress.  Abdominal:     General: There is no distension.  Musculoskeletal:        General: Normal range of motion.     Cervical back: Neck supple.  Skin:    General: Skin is warm and dry.  Neurological:     Mental Status: She is alert and oriented to person, place, and time.      UC Treatments / Results  Labs (all labs ordered are listed, but only abnormal results are displayed) Labs Reviewed - No data to display  EKG   Radiology No results found.  Procedures Procedures (including critical care time)  Medications Ordered in UC Medications - No data to display  Initial Impression / Assessment and Plan / UC Course  I have reviewed the triage vital signs and the nursing notes.  Pertinent labs & imaging results that were available during my care of the patient were reviewed by me and considered in my medical decision making (see chart for details).     No oral lesions noted as cause of pain, no soft palate swelling, possible dental infection contributing to discomfort.  Will cover with amoxicillin and continue Tylenol and ibuprofen for mild to moderate pain.  Provided 6 tablets of hydrocodone to use for severe pain.  Registry checked, no previous prescriptions in the past 2 years.  Discussed risks associated with this medicine and advised to use sparingly.  Do not drive or work after taking.  Also provided prescription for Flagyl to treat BV.  Discussed strict return precautions. Patient verbalized understanding and is agreeable with plan.  Final Clinical Impressions(s) / UC  Diagnoses   Final diagnoses:  Pain, dental  Bacterial vaginosis     Discharge Instructions     Please use dental resource to contact offices to seek permenant treatment/relief.   Begin amoxicillin twice daily for 1 week. Begin flagyl/metronidazole for bacterial vaginosis  For pain please take 600mg -800mg  of Ibuprofen every 8 hours, take with 1000 mg of Tylenol Extra strength every 8 hours. These are safe to take together. Please take with food.   I have also provided 2 days worth of stronger pain medication. This should only be used for severe pain. Do not drive or operate machinery while taking this medication.   Please return if you start to experience significant swelling of your face, experiencing fever.   ED Prescriptions    Medication Sig Dispense Auth. Provider   ibuprofen (ADVIL) 800 MG tablet Take 1 tablet (800 mg total) by mouth 3 (three) times daily. 21 tablet Keyen Marban C, PA-C   HYDROcodone-acetaminophen (NORCO/VICODIN) 5-325 MG tablet Take 1 tablet by mouth every 6 (six) hours as needed for severe pain. 6 tablet Maryse Brierley C, PA-C   metroNIDAZOLE (FLAGYL) 500 MG tablet Take 1 tablet (500 mg total) by mouth 2 (two) times daily for 7 days. 14 tablet Shatonya Passon C, PA-C   amoxicillin (AMOXIL) 500 MG capsule Take 1 capsule (500 mg total) by mouth 2 (two) times daily for 7 days. 14 capsule Georgean Spainhower, Ramona C, PA-C     I have reviewed the PDMP during this encounter.   Brydan Downard, Emery C, PA-C 01/06/19 1050

## 2019-01-06 NOTE — Discharge Instructions (Signed)
Please use dental resource to contact offices to seek permenant treatment/relief.   Begin amoxicillin twice daily for 1 week. Begin flagyl/metronidazole for bacterial vaginosis  For pain please take 600mg -800mg  of Ibuprofen every 8 hours, take with 1000 mg of Tylenol Extra strength every 8 hours. These are safe to take together. Please take with food.   I have also provided 2 days worth of stronger pain medication. This should only be used for severe pain. Do not drive or operate machinery while taking this medication.   Please return if you start to experience significant swelling of your face, experiencing fever.

## 2019-04-02 ENCOUNTER — Ambulatory Visit (HOSPITAL_COMMUNITY)
Admission: EM | Admit: 2019-04-02 | Discharge: 2019-04-02 | Disposition: A | Discharge status: Home / Self Care | Payer: Medicaid Other | Attending: Family Medicine | Admitting: Family Medicine

## 2019-04-02 ENCOUNTER — Other Ambulatory Visit: Payer: Self-pay

## 2019-04-02 ENCOUNTER — Encounter (HOSPITAL_COMMUNITY): Payer: Self-pay | Admitting: Emergency Medicine

## 2019-04-02 DIAGNOSIS — Z3201 Encounter for pregnancy test, result positive: Secondary | ICD-10-CM

## 2019-04-02 DIAGNOSIS — K0889 Other specified disorders of teeth and supporting structures: Secondary | ICD-10-CM

## 2019-04-02 DIAGNOSIS — K011 Impacted teeth: Secondary | ICD-10-CM

## 2019-04-02 DIAGNOSIS — R102 Pelvic and perineal pain: Secondary | ICD-10-CM | POA: Diagnosis not present

## 2019-04-02 DIAGNOSIS — N939 Abnormal uterine and vaginal bleeding, unspecified: Secondary | ICD-10-CM | POA: Diagnosis not present

## 2019-04-02 LAB — POC URINE PREG, ED
Preg Test, Ur: POSITIVE — AB
Preg Test, Ur: POSITIVE — AB

## 2019-04-02 LAB — POCT URINALYSIS DIP (DEVICE)
Bilirubin Urine: NEGATIVE
Glucose, UA: NEGATIVE mg/dL
Ketones, ur: NEGATIVE mg/dL
Leukocytes,Ua: NEGATIVE
Nitrite: NEGATIVE
Protein, ur: NEGATIVE mg/dL
Specific Gravity, Urine: >=1.03 (ref 1.005–1.030)
Urobilinogen, UA: 0.2 mg/dL (ref 0.0–1.0)
pH: 7 (ref 5.0–8.0)

## 2019-04-02 LAB — POCT PREGNANCY, URINE: Preg Test, Ur: POSITIVE — AB

## 2019-04-02 MED ORDER — AMOXICILLIN 875 MG PO TABS: 875.0000 mg | ORAL_TABLET | Freq: Two times a day (BID) | ORAL | 0 refills | Status: DC

## 2019-04-02 NOTE — ED Provider Notes (Signed)
[facility]   MRN: [id number] DOB: November 28, 1995  Subjective:   ROSHUNDA KEIR is a 24 y.o. female presenting for 1 month history of persistent and worsening left upper dental pain.  She had previously gotten a prescription for an antibiotic but unfortunately lost this after taking only 1 pill.  Has been using multiple over-the-counter NSAIDs to help her with her dental pain.  Her pain persisted and is now worse.  She does not have a dentist.  She would also like to be checked for pregnancy.  States that she took a pregnancy test last month and was positive.  This week she has had pelvic cramping and bleeding.  Patient states that she just wanted to know if she was still pregnant, is not interested in much outside of that except for getting an antibiotic for her dental pain.  States that she does not intend on keeping the pregnancy, has had an abortion previously.   No current facility-administered medications for this encounter.  Current Outpatient Medications:  .  HYDROcodone-acetaminophen (NORCO/VICODIN) 5-325 MG tablet, Take 1 tablet by mouth every 6 (six) hours as needed for severe pain. (Patient not taking: Reported on 04/02/2019), Disp: 6 tablet, Rfl: 0 .  ibuprofen (ADVIL) 800 MG tablet, Take 1 tablet (800 mg total) by mouth 3 (three) times daily. (Patient not taking: Reported on 04/02/2019), Disp: 21 tablet, Rfl: 0    Allergies  Allergen Reactions  . Septra [Bactrim] Rash    Past Medical History:  Diagnosis Date  . Anemia   . Chlamydia   . Heart murmur   . Hx of seasonal allergies   . Infection    uti  . Pregnancy induced hypertension   . Subarachnoid hemorrhage following injury (HCC) 08/30/2011  . 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 NO PAST SURGERIES      Family History  Problem Relation Age of Onset  . Hypertension Mother   . Hypertension Maternal Aunt   . Hypertension Maternal Uncle   .  Hypertension Maternal Grandmother   . Asthma Maternal Grandmother   . Hypertension Maternal Grandfather   . Diabetes Paternal Grandmother   . Diabetes Paternal Grandfather   . Asthma Brother     Social History   Tobacco Use  . Smoking status: Current Some Day Smoker    Packs/day: 0.00    Types: Cigarettes  . Smokeless tobacco: Never Used  Substance Use Topics  . Alcohol use: No  . Drug use: Not Currently    Types: Marijuana    Comment: June 2019    ROS   Objective:   Vitals: BP 135/84 (BP Location: Left Arm)   Pulse 61   Temp 98.6 F (37 C) (Oral)   Resp 18   LMP  (LMP Unknown)   SpO2 100%   Physical Exam Constitutional:      General: She is not in acute distress.    Appearance: Normal appearance. She is well-developed. She is not ill-appearing, toxic-appearing or diaphoretic.  HENT:     Head: Normocephalic and atraumatic.     Nose: Nose normal.     Mouth/Throat:     Mouth: Mucous membranes are moist.     Pharynx: Oropharynx is clear.     Comments: Impacted posterior molars of left and right upper sides.  There is surrounding gingival swelling bilaterally. Eyes:     General: No scleral icterus.    Extraocular Movements: Extraocular movements intact.  Pupils: Pupils are equal, round, and reactive to light.  Cardiovascular:     Rate and Rhythm: Normal rate.  Pulmonary:     Effort: Pulmonary effort is normal.  Abdominal:     General: Bowel sounds are normal. There is no distension.     Palpations: Abdomen is soft. There is no mass.     Tenderness: There is no abdominal tenderness. There is no right CVA tenderness, left CVA tenderness, guarding or rebound.  Skin:    General: Skin is warm and dry.  Neurological:     General: No focal deficit present.     Mental Status: She is alert and oriented to person, place, and time.  Psychiatric:        Mood and Affect: Mood normal.        Behavior: Behavior normal.        Thought Content: Thought content normal.         Judgment: Judgment normal.     Results for orders placed or performed during the hospital encounter of 04/02/19 (from the past 24 hour(s))  Pregnancy, urine POC     Status: Abnormal   Collection Time: 04/02/19  8:43 AM  Result Value Ref Range   Preg Test, Ur POSITIVE (A) NEGATIVE  POCT urinalysis dip (device)     Status: Abnormal   Collection Time: 04/02/19  8:43 AM  Result Value Ref Range   Glucose, UA NEGATIVE NEGATIVE mg/dL   Bilirubin Urine NEGATIVE NEGATIVE   Ketones, ur NEGATIVE NEGATIVE mg/dL   Specific Gravity, Urine >=1.030 1.005 - 1.030   Hgb urine dipstick LARGE (A) NEGATIVE   pH 7.0 5.0 - 8.0   Protein, ur NEGATIVE NEGATIVE mg/dL   Urobilinogen, UA 0.2 0.0 - 1.0 mg/dL   Nitrite NEGATIVE NEGATIVE   Leukocytes,Ua NEGATIVE NEGATIVE  POC urine preg, ED (not at [facility])     Status: Abnormal   Collection Time: 04/02/19  8:43 AM  Result Value Ref Range   Preg Test, Ur POSITIVE (A) NEGATIVE  POC urine preg, ED (not at [facility])     Status: Abnormal   Collection Time: 04/02/19  8:43 AM  Result Value Ref Range   Preg Test, Ur POSITIVE (A) NEGATIVE    Assessment and Plan :   1. Impacted molar   2. Pain, dental   3. Positive pregnancy test   4. Vaginal bleeding   5. Pelvic cramping     Emphasized the need to be evaluated in the MAU due to her pelvic cramping, vaginal bleeding and positive pregnancy test today.  Patient states that she does not plan on going, wants to go to work.  Counseled that I would give her a note for work, patient states that she will consider reporting to the MAU.  Recommended she use Tylenol for her pain, start amoxicillin for her teeth and contact the dental surgeon.  Information provided to the patient. Counseled patient on potential for adverse effects with medications prescribed today, patient verbalized understanding.    Jaynee Eagles, [place] 04/02/19 [phone]

## 2019-04-02 NOTE — ED Triage Notes (Signed)
Pt sts left sided dental pain x several weeks; pt was seen but "lost" antibiotic rx; pt sts took home pregnancy test 1 month ago that was positive; now having bleeding intermittently x 1 week; pt denies having any OB visit for pregnancy

## 2019-04-02 NOTE — Discharge Instructions (Addendum)
Please stop using all nonsteroidal anti-inflammatory medications.  These medicines are available and the counter and include ibuprofen, Motrin, Advil, Aleve, naproxen.  Your pregnancy test was positive today and given your vaginal bleeding this past week would be important to be evaluated in the maternal admission unit at the [facility].  I highly recommend you go there now as they can evaluate you further and make sure that you are not having a medical emergency related to pregnancy.  You may take 500mg -650mg  Tylenol every 6 hours for pain and inflammation.   [facility] [phone] extension [phone]  Dr. [phone] [address]  [place] [phone] [address] [facility] [place].  [facility] [phone] extension [phone] [place], [address], [place] First come first serve for the first 10 clients.  May do simple extractions only, no wisdom teeth or surgery.  You may try the second for Thursday of the month starting at 6:30 AM.  [facility] You may call the school to see if they are still helping to provide dental care for emergent cases.

## 2019-04-07 ENCOUNTER — Encounter (HOSPITAL_COMMUNITY): Payer: Self-pay | Admitting: Obstetrics and Gynecology

## 2019-04-07 ENCOUNTER — Encounter (HOSPITAL_COMMUNITY)
Admission: AD | Disposition: A | Discharge status: Home / Self Care | Payer: Self-pay | Source: Home / Self Care | Attending: Obstetrics and Gynecology

## 2019-04-07 ENCOUNTER — Inpatient Hospital Stay (HOSPITAL_COMMUNITY): Payer: Medicaid Other

## 2019-04-07 ENCOUNTER — Ambulatory Visit (HOSPITAL_COMMUNITY)
Admission: AD | Admit: 2019-04-07 | Discharge: 2019-04-08 | Disposition: A | Discharge status: Home / Self Care | Payer: Medicaid Other | Attending: Obstetrics and Gynecology | Admitting: Obstetrics and Gynecology

## 2019-04-07 ENCOUNTER — Other Ambulatory Visit: Payer: Self-pay

## 2019-04-07 DIAGNOSIS — O034 Incomplete spontaneous abortion without complication: Secondary | ICD-10-CM | POA: Diagnosis present

## 2019-04-07 DIAGNOSIS — Z3A14 14 weeks gestation of pregnancy: Secondary | ICD-10-CM | POA: Insufficient documentation

## 2019-04-07 DIAGNOSIS — Z20822 Contact with and (suspected) exposure to covid-19: Secondary | ICD-10-CM | POA: Insufficient documentation

## 2019-04-07 DIAGNOSIS — R42 Dizziness and giddiness: Secondary | ICD-10-CM | POA: Diagnosis not present

## 2019-04-07 DIAGNOSIS — F17210 Nicotine dependence, cigarettes, uncomplicated: Secondary | ICD-10-CM | POA: Insufficient documentation

## 2019-04-07 DIAGNOSIS — Z79899 Other long term (current) drug therapy: Secondary | ICD-10-CM | POA: Insufficient documentation

## 2019-04-07 DIAGNOSIS — Z882 Allergy status to sulfonamides status: Secondary | ICD-10-CM | POA: Diagnosis not present

## 2019-04-07 DIAGNOSIS — R9389 Abnormal findings on diagnostic imaging of other specified body structures: Secondary | ICD-10-CM | POA: Diagnosis not present

## 2019-04-07 DIAGNOSIS — I10 Essential (primary) hypertension: Secondary | ICD-10-CM | POA: Diagnosis not present

## 2019-04-07 DIAGNOSIS — Z791 Long term (current) use of non-steroidal anti-inflammatories (NSAID): Secondary | ICD-10-CM | POA: Insufficient documentation

## 2019-04-07 DIAGNOSIS — O209 Hemorrhage in early pregnancy, unspecified: Secondary | ICD-10-CM

## 2019-04-07 DIAGNOSIS — Z881 Allergy status to other antibiotic agents status: Secondary | ICD-10-CM | POA: Diagnosis not present

## 2019-04-07 HISTORY — PX: DILATION AND CURETTAGE OF UTERUS: SHX78

## 2019-04-07 LAB — CBC
HCT: 25.6 % — ABNORMAL LOW (ref 36.0–46.0)
Hemoglobin: 8.2 g/dL — ABNORMAL LOW (ref 12.0–15.0)
MCH: 31.8 pg (ref 26.0–34.0)
MCHC: 32 g/dL (ref 30.0–36.0)
MCV: 99.2 fL (ref 80.0–100.0)
Platelets: 199 10*3/uL (ref 150–400)
RBC: 2.58 MIL/uL — ABNORMAL LOW (ref 3.87–5.11)
RDW: 11.7 % (ref 11.5–15.5)
WBC: 9.9 10*3/uL (ref 4.0–10.5)
nRBC: 0 % (ref 0.0–0.2)

## 2019-04-07 LAB — RESPIRATORY PANEL BY RT PCR (FLU A&B, COVID)
Influenza A by PCR: NEGATIVE
Influenza B by PCR: NEGATIVE
SARS Coronavirus 2 by RT PCR: NEGATIVE

## 2019-04-07 LAB — TYPE AND SCREEN
ABO/RH(D): B POS
Antibody Screen: NEGATIVE

## 2019-04-07 SURGERY — DILATION AND CURETTAGE
Anesthesia: General

## 2019-04-07 MED ORDER — BUPIVACAINE HCL (PF) 0.25 % IJ SOLN
INTRAMUSCULAR | Status: AC
  Filled 2019-04-07: qty 30

## 2019-04-07 MED ORDER — DOXYCYCLINE HYCLATE 100 MG PO TABS
200.0000 mg | ORAL_TABLET | Freq: Once | ORAL | Status: AC
  Administered 2019-04-07: 200 mg via ORAL
  Filled 2019-04-07: qty 2

## 2019-04-07 MED ORDER — MISOPROSTOL 200 MCG PO TABS
800.0000 ug | ORAL_TABLET | Freq: Once | ORAL | Status: AC
  Administered 2019-04-07: 800 ug via BUCCAL

## 2019-04-07 MED ORDER — 0.9 % SODIUM CHLORIDE (POUR BTL) OPTIME
TOPICAL | Status: DC | PRN
  Administered 2019-04-07: 1000 mL

## 2019-04-07 MED ORDER — SODIUM CHLORIDE 0.9 % IV BOLUS
1000.0000 mL | Freq: Once | INTRAVENOUS | Status: AC
  Administered 2019-04-07 (×2): 1000 mL via INTRAVENOUS

## 2019-04-07 MED ORDER — METHYLERGONOVINE MALEATE 0.2 MG/ML IJ SOLN
0.2000 mg | Freq: Once | INTRAMUSCULAR | Status: AC
  Administered 2019-04-07: 0.2 mg via INTRAMUSCULAR
  Filled 2019-04-07: qty 1

## 2019-04-07 MED ORDER — MISOPROSTOL 200 MCG PO TABS
ORAL_TABLET | ORAL | Status: AC
  Filled 2019-04-07: qty 4

## 2019-04-07 SURGICAL SUPPLY — 21 items
CATH ROBINSON RED A/P 16FR (CATHETERS) ×3 IMPLANT
DECANTER SPIKE VIAL GLASS SM (MISCELLANEOUS) ×3 IMPLANT
FILTER UTR ASPR ASSEMBLY (MISCELLANEOUS) ×3 IMPLANT
GLOVE BIO SURGEON STRL SZ 6 (GLOVE) ×3 IMPLANT
GLOVE BIOGEL PI IND STRL 7.0 (GLOVE) ×1 IMPLANT
GLOVE BIOGEL PI INDICATOR 7.0 (GLOVE) ×2
GOWN STRL REUS W/ TWL LRG LVL3 (GOWN DISPOSABLE) ×2 IMPLANT
GOWN STRL REUS W/TWL LRG LVL3 (GOWN DISPOSABLE) ×6
HOSE CONNECTING 18IN BERKELEY (TUBING) ×3 IMPLANT
KIT BERKELEY 1ST TRI 3/8 NO TR (MISCELLANEOUS) ×3 IMPLANT
KIT BERKELEY 1ST TRIMESTER 3/8 (MISCELLANEOUS) ×6 IMPLANT
NS IRRIG 1000ML POUR BTL (IV SOLUTION) ×3 IMPLANT
PACK VAGINAL MINOR WOMEN LF (CUSTOM PROCEDURE TRAY) ×3 IMPLANT
PAD OB MATERNITY 4.3X12.25 (PERSONAL CARE ITEMS) ×3 IMPLANT
SET BERKELEY SUCTION TUBING (SUCTIONS) ×3 IMPLANT
TOWEL GREEN STERILE FF (TOWEL DISPOSABLE) ×6 IMPLANT
UNDERPAD 30X36 HEAVY ABSORB (UNDERPADS AND DIAPERS) ×3 IMPLANT
VACURETTE 10 RIGID CVD (CANNULA) IMPLANT
VACURETTE 7MM CVD STRL WRAP (CANNULA) IMPLANT
VACURETTE 8 RIGID CVD (CANNULA) ×3 IMPLANT
VACURETTE 9 RIGID CVD (CANNULA) IMPLANT

## 2019-04-07 NOTE — MAU Note (Signed)
Pt reports to MAU VIA EMS for vaginal bleeding related to induced abortion. The pt received care for this at [facility]. The pt states she was told to call for help if she filled up three pads in one hour. She states that she has not eaten today except for right before she came here, she had started to eat  chinese food. She  States that she was getting up to go to the bathroom and blood and clots came out, enough to make her concerned. She also states that she became dizzy so she sat down so that she would not fall, she states that she never lost consciousness. Pt states that she took 800 ibuprofen that she was given at [facility] for pain management.

## 2019-04-07 NOTE — MAU Provider Note (Signed)
Chief Complaint: Vaginal Bleeding   First Provider Initiated Contact with Patient 04/07/19 2028     SUBJECTIVE HPI: Evelyn Mcclain is a 24 y.o. G3P0101 at [redacted]w[redacted]d by unsure LMP who presents to [facility] by EMS reporting vaginal bleeding.  States she went to a [facility] yesterday for termination. Was told she was [redacted]w[redacted]d by ultrasound yesterday. Took medication for abortion this morning around 1130 am. States she started bleeding immediately but became heavier this afternoon. States she has had to stay on the toilet because she feels so much blood and blood clots come out. Has felt light headed & dizzy; no LOC. Some abdominal cramping but not bad at this time.   Location: abdomen Quality: cramping Severity: currently denies/10 on pain scale Duration: 1 day Timing: intermittent Modifying factors: none Associated signs and symptoms: vaginal bleeding & dizziness  Past Medical History:  Diagnosis Date  . Anemia   . Chlamydia   . Heart murmur   . Hx of seasonal allergies   . Infection    uti  . Pregnancy induced hypertension   . Subarachnoid hemorrhage following injury ([place]) 08/30/2011  . Subdural hematoma, post-traumatic ([place]) 08/30/2011   OB History  Gravida Para Term Preterm AB Living  2 1   1  0 1  SAB TAB Ectopic Multiple Live Births  0     0 1    # Outcome Date GA Lbr Len/2nd Weight Sex Delivery Anes PTL Lv  2 Current           1 Preterm 08/12/17 [redacted]w[redacted]d 02:05 / 00:10 1740 g F Vag-Spont EPI  LIV   Past Surgical History:  Procedure Laterality Date  . NO PAST SURGERIES     Social History   Socioeconomic History  . Marital status: Single    Spouse name: Not on file  . Number of children: Not on file  . Years of education: Not on file  . Highest education level: Not on file  Occupational History  . Not on file  Tobacco Use  . Smoking status: Current Some Day Smoker    Packs/day: 0.00    Types: Cigarettes  . Smokeless tobacco: Never Used  Substance and Sexual  Activity  . Alcohol use: No  . Drug use: Not Currently    Types: Marijuana    Comment: June 2019  . Sexual activity: Yes    Birth control/protection: None  Other Topics Concern  . Not on file  Social History Narrative  . Not on file   Social Determinants of Health   Financial Resource Strain:   . Difficulty of Paying Living Expenses:   Food Insecurity:   . Worried About Charity fundraiser in the Last Year:   . Arboriculturist in the Last Year:   Transportation Needs:   . Film/video editor (Medical):   Marland Kitchen Lack of Transportation (Non-Medical):   Physical Activity:   . Days of Exercise per Week:   . Minutes of Exercise per Session:   Stress:   . Feeling of Stress :   Social Connections:   . Frequency of Communication with Friends and Family:   . Frequency of Social Gatherings with Friends and Family:   . Attends Religious Services:   . Active Member of Clubs or Organizations:   . Attends Archivist Meetings:   Marland Kitchen Marital Status:   Intimate Partner Violence:   . Fear of Current or Ex-Partner:   . Emotionally Abused:   Marland Kitchen Physically  Abused:   . Sexually Abused:    Family History  Problem Relation Age of Onset  . Hypertension Mother   . Hypertension Maternal Aunt   . Hypertension Maternal Uncle   . Hypertension Maternal Grandmother   . Asthma Maternal Grandmother   . Hypertension Maternal Grandfather   . Diabetes Paternal Grandmother   . Diabetes Paternal Grandfather   . Asthma Brother    No current facility-administered medications on file prior to encounter.   Current Outpatient Medications on File Prior to Encounter  Medication Sig Dispense Refill  . amoxicillin (AMOXIL) 875 MG tablet Take 1 tablet (875 mg total) by mouth 2 (two) times daily. 20 tablet 0  . ibuprofen (ADVIL) 800 MG tablet Take 1 tablet (800 mg total) by mouth 3 (three) times daily. 21 tablet 0  . HYDROcodone-acetaminophen (NORCO/VICODIN) 5-325 MG tablet Take 1 tablet by mouth every  6 (six) hours as needed for severe pain. (Patient not taking: Reported on 04/02/2019) 6 tablet 0  . [DISCONTINUED] labetalol (NORMODYNE) 200 MG tablet Take 4 tablets (800 mg total) by mouth 3 (three) times daily. (Patient not taking: Reported on 01/06/2019) 360 tablet 1  . [DISCONTINUED] NIFEdipine (PROCARDIA-XL/ADALAT CC) 30 MG 24 hr tablet Take 1 tablet (30 mg total) by mouth daily. (Patient not taking: Reported on 01/06/2019) 30 tablet 1  . [DISCONTINUED] promethazine (PHENERGAN) 12.5 MG tablet Take 1 tablet (12.5 mg total) by mouth every 6 (six) hours as needed for nausea or vomiting. (Patient not taking: Reported on 03/14/2017) 30 tablet 0   Allergies  Allergen Reactions  . Septra [Bactrim] Rash    I have reviewed patient's Past Medical Hx, Surgical Hx, Family Hx, Social Hx, medications and allergies.   Review of Systems  Constitutional: Negative.   Gastrointestinal: Positive for abdominal pain. Negative for diarrhea, nausea and vomiting.  Genitourinary: Positive for vaginal bleeding.  Neurological: Positive for dizziness and light-headedness. Negative for syncope.    OBJECTIVE Patient Vitals for the past 24 hrs:  BP Temp Temp src Pulse SpO2  04/07/19 2127 (!) 97/56 -- -- 66 --  04/07/19 2111 (!) 87/52 -- -- 63 --  04/07/19 2056 (!) 67/28 -- -- 65 --  04/07/19 2043 (!) 79/40 -- -- 68 --  04/07/19 2040 (!) 84/37 -- -- 79 --  04/07/19 2016 (!) 110/55 -- -- 86 --  04/07/19 2005 -- -- -- -- 99 %  04/07/19 2004 125/63 -- -- 93 --  04/07/19 2000 -- 98.4 F (36.9 C) Oral -- --   Constitutional: Well-developed, well-nourished female in no acute distress.  Cardiovascular: normal rate & rhythm, no murmur Respiratory: normal rate and effort. Lung sounds clear throughout GI: Abd soft, non-tender, Pos BS x 4. No guarding or rebound tenderness MS: Extremities nontender, no edema, normal ROM Neurologic: Alert and oriented x 4.  Skin: cool to touch & clammy GU:     SPECULUM EXAM: copious  amount of bright red blood. 14 fox swabs & ring forceps used to remove blood & clots. Still can visualize clots & ?tissue in vagina but can't remove to visualize cervix & pt not tolerating exam well at that point.   LAB RESULTS Results for orders placed or performed during the hospital encounter of 04/07/19 (from the past 24 hour(s))  CBC     Status: Abnormal   Collection Time: 04/07/19  9:05 PM  Result Value Ref Range   WBC 9.9 4.0 - 10.5 K/uL   RBC 2.58 (L) 3.87 - 5.11 MIL/uL  Hemoglobin 8.2 (L) 12.0 - 15.0 g/dL   HCT 58.5 (L) 27.7 - 82.4 %   MCV 99.2 80.0 - 100.0 fL   MCH 31.8 26.0 - 34.0 pg   MCHC 32.0 30.0 - 36.0 g/dL   RDW 23.5 36.1 - 44.3 %   Platelets 199 150 - 400 K/uL   nRBC 0.0 0.0 - 0.2 %  Type and screen     Status: None (Preliminary result)   Collection Time: 04/07/19  9:05 PM  Result Value Ref Range   ABO/RH(D) PENDING    Antibody Screen PENDING    Sample Expiration      04/10/2019,2359 Performed at [facility], [address] [place], [place] [address]     IMAGING No results found.  MAU COURSE Orders Placed This Encounter  Procedures  . US OB LESS THAN 14 WEEKS WITH OB TRANSVAGINAL  . CBC  . hCG, quantitative, pregnancy  . Diet NPO time specified  . Orthostatic vital signs  . Type and screen   Meds ordered this encounter  Medications  . sodium chloride 0.9 % bolus 1,000 mL  . misoprostol (CYTOTEC) tablet 800 mcg  . misoprostol (CYTOTEC) 200 MCG tablet    Sedano, Alondra   : cabinet override    MDM Pt unable to sit up without dizziness & nausea. Pt become hypotensive while in supine position. Large amount of bleeding on exam IV fluid bolus with normal saline Pt reports some improvement in dizziness & BP has risen some.   Dr. Jeanann Lewandowsky notified & down to unit to evaluate patient   Judeth Horn, NP 04/07/2019  9:48 PM

## 2019-04-08 ENCOUNTER — Inpatient Hospital Stay (HOSPITAL_COMMUNITY): Payer: Medicaid Other | Admitting: Certified Registered"

## 2019-04-08 DIAGNOSIS — O034 Incomplete spontaneous abortion without complication: Secondary | ICD-10-CM | POA: Diagnosis present

## 2019-04-08 DIAGNOSIS — F17210 Nicotine dependence, cigarettes, uncomplicated: Secondary | ICD-10-CM | POA: Diagnosis not present

## 2019-04-08 DIAGNOSIS — Z20822 Contact with and (suspected) exposure to covid-19: Secondary | ICD-10-CM | POA: Diagnosis not present

## 2019-04-08 DIAGNOSIS — Z3A14 14 weeks gestation of pregnancy: Secondary | ICD-10-CM | POA: Diagnosis not present

## 2019-04-08 LAB — CBC
HCT: 21.9 % — ABNORMAL LOW (ref 36.0–46.0)
Hemoglobin: 7 g/dL — ABNORMAL LOW (ref 12.0–15.0)
MCH: 31.7 pg (ref 26.0–34.0)
MCHC: 32 g/dL (ref 30.0–36.0)
MCV: 99.1 fL (ref 80.0–100.0)
Platelets: 132 10*3/uL — ABNORMAL LOW (ref 150–400)
RBC: 2.21 MIL/uL — ABNORMAL LOW (ref 3.87–5.11)
RDW: 11.8 % (ref 11.5–15.5)
WBC: 6.6 10*3/uL (ref 4.0–10.5)
nRBC: 0 % (ref 0.0–0.2)

## 2019-04-08 LAB — ABO/RH: ABO/RH(D): B POS

## 2019-04-08 MED ORDER — LACTATED RINGERS IV SOLN: INTRAVENOUS | Status: DC | PRN

## 2019-04-08 MED ORDER — OXYCODONE HCL 5 MG PO TABS: 5.0000 mg | ORAL_TABLET | Freq: Once | ORAL | Status: DC | PRN

## 2019-04-08 MED ORDER — FERROUS SULFATE 325 (65 FE) MG PO TABS: 325.0000 mg | ORAL_TABLET | Freq: Every day | ORAL | 3 refills | Status: DC

## 2019-04-08 MED ORDER — MIDAZOLAM HCL 2 MG/2ML IJ SOLN
INTRAMUSCULAR | Status: AC
  Filled 2019-04-08: qty 2

## 2019-04-08 MED ORDER — FENTANYL CITRATE (PF) 100 MCG/2ML IJ SOLN: 25.0000 ug | INTRAMUSCULAR | Status: DC | PRN

## 2019-04-08 MED ORDER — IBUPROFEN 800 MG PO TABS: 800.0000 mg | ORAL_TABLET | Freq: Three times a day (TID) | ORAL | 0 refills | Status: DC

## 2019-04-08 MED ORDER — MIDAZOLAM HCL 5 MG/5ML IJ SOLN
INTRAMUSCULAR | Status: DC | PRN
  Administered 2019-04-08: 2 mg via INTRAVENOUS

## 2019-04-08 MED ORDER — DEXAMETHASONE SODIUM PHOSPHATE 10 MG/ML IJ SOLN
INTRAMUSCULAR | Status: DC | PRN
  Administered 2019-04-08: 5 mg via INTRAVENOUS

## 2019-04-08 MED ORDER — MISOPROSTOL 200 MCG PO TABS
800.0000 ug | ORAL_TABLET | Freq: Once | ORAL | Status: AC
  Administered 2019-04-08: 800 ug via ORAL
  Filled 2019-04-08: qty 4

## 2019-04-08 MED ORDER — LIDOCAINE 2% (20 MG/ML) 5 ML SYRINGE
INTRAMUSCULAR | Status: DC | PRN
  Administered 2019-04-08: 40 mg via INTRAVENOUS

## 2019-04-08 MED ORDER — TRANEXAMIC ACID-NACL 1000-0.7 MG/100ML-% IV SOLN
INTRAVENOUS | Status: AC
  Filled 2019-04-08: qty 100

## 2019-04-08 MED ORDER — FENTANYL CITRATE (PF) 100 MCG/2ML IJ SOLN
INTRAMUSCULAR | Status: DC | PRN
  Administered 2019-04-08: 75 ug via INTRAVENOUS

## 2019-04-08 MED ORDER — ONDANSETRON HCL 4 MG/2ML IJ SOLN
INTRAMUSCULAR | Status: DC | PRN
  Administered 2019-04-08: 4 mg via INTRAVENOUS

## 2019-04-08 MED ORDER — PROPOFOL 10 MG/ML IV BOLUS
INTRAVENOUS | Status: DC | PRN
  Administered 2019-04-08: 180 mg via INTRAVENOUS

## 2019-04-08 MED ORDER — FENTANYL CITRATE (PF) 250 MCG/5ML IJ SOLN
INTRAMUSCULAR | Status: AC
  Filled 2019-04-08: qty 5

## 2019-04-08 MED ORDER — PROPOFOL 10 MG/ML IV BOLUS
INTRAVENOUS | Status: AC
  Filled 2019-04-08: qty 20

## 2019-04-08 MED ORDER — OXYCODONE HCL 5 MG/5ML PO SOLN: 5.0000 mg | Freq: Once | ORAL | Status: DC | PRN

## 2019-04-08 MED ORDER — TRANEXAMIC ACID-NACL 1000-0.7 MG/100ML-% IV SOLN
1000.0000 mg | INTRAVENOUS | Status: AC
  Administered 2019-04-08: 1000 mg via INTRAVENOUS
  Filled 2019-04-08 (×2): qty 100

## 2019-04-08 MED ORDER — BUPIVACAINE HCL (PF) 0.25 % IJ SOLN
INTRAMUSCULAR | Status: DC | PRN
  Administered 2019-04-08: 10 mL

## 2019-04-08 MED ORDER — ONDANSETRON HCL 4 MG/2ML IJ SOLN: 4.0000 mg | Freq: Once | INTRAMUSCULAR | Status: DC | PRN

## 2019-04-08 NOTE — Anesthesia Procedure Notes (Signed)
Procedure Name: LMA Insertion Date/Time: 04/08/2019 3:26 AM Performed by: Elliot Dally, CRNA Pre-anesthesia Checklist: Patient identified, Emergency Drugs available, Suction available and Patient being monitored Patient Re-evaluated:Patient Re-evaluated prior to induction Oxygen Delivery Method: Circle System Utilized Preoxygenation: Pre-oxygenation with 100% oxygen Induction Type: IV induction Ventilation: Mask ventilation without difficulty LMA: LMA inserted LMA Size: 4.0 Number of attempts: 1 Airway Equipment and Method: Bite block Placement Confirmation: positive ETCO2 Tube secured with: Tape Dental Injury: Teeth and Oropharynx as per pre-operative assessment

## 2019-04-08 NOTE — MAU Note (Signed)
Report called to Main OR Anesthesia.

## 2019-04-08 NOTE — Op Note (Signed)
Procedure(s): SUCTION DILATATION AND CURETTAGE Procedure Note  Evelyn Mcclain female 24 y.o. 04/08/2019  Procedure(s) and Anesthesia Type:    * SUCTION DILATATION AND CURETTAGE - [facility]  Surgeon(s) and Role:    Catalina Pizza, MD - Primary   Indications: The patient presented with incomplete surgical abortion with bleeding.  She failed to pass tissue with cytotec and methergine and was consented for suction dilation and curettage after discussion of risks, benefits, and alternatives.     Surgeon: Catalina Pizza   Assistants: none  Anesthesia: General endotracheal anesthesia  Procedure Detail  SUCTION DILATATION AND CURETTAGE  Findings: Anteverted slightly enlarged uterus with moderate bleeding on exam and moderate clot in the vault.  Cervix dilated approximately 1cm.  Products within lower uterine segment.  Good hemostasis at end of case.  Estimated Blood Loss:  25cc of surgical blood loss         Drains: in and out cath at start of case         UOP: 100cc  Blood Given: none          Specimens: products of conception         Implants: none        Complications:  No surgical complication         Disposition: PACU - hemodynamically stable.         Condition: stable  Procedural Narrative Patient was consented for the procedure.  She received 200mg  PO doxycycline for prophylaxis.  She was taken to the OR where general anesthesia was induced and she was positioned in dorsal lithotomy position in yellowfin stirrups.  She was prepped and draped in the normal sterile fashion and her bladder was drained.  Bimanual exam performed with the findings above.  Speculum inserted and anterior lip of the cervix grasped with a single tooth tenaculum.  Cervix was dilated about 1cm.  Largest pratt dilator passed with ease.  8 curved suction curette introduced into the uterus and on first pass a large amount of tissue was extracted which obstructed the tubing.  The tubing was withdrawn and  tissue removed.  Two additional passes did not result in any further tissue return.  Bimanual massage reveals good uterine tone.  An additional pass of the curette did not reveal any further tissue.  Additional massage was performed with good tone.  1g TXA was given in the OR for prophylaxis.  Paracervical block was performed with 10cc of 0.25% marcaine.  Counts were reported as correct.  Procedure was complete with no complications and patient was stable for transfer to recovery room.   , MD 04/08/2019 4:27 AM

## 2019-04-08 NOTE — Transfer of Care (Signed)
Immediate Anesthesia Transfer of Care Note  Patient: Evelyn Mcclain  Procedure(s) Performed: SUCTION DILATATION AND CURETTAGE (N/A )  Patient Location: PACU  Anesthesia Type:General  Level of Consciousness: drowsy  Airway & Oxygen Therapy: Patient Spontanous Breathing and Patient connected to face mask oxygen  Post-op Assessment: Report given to RN and Post -op Vital signs reviewed and stable  Post vital signs: Reviewed and stable  Last Vitals:  Vitals Value Taken Time  BP 107/43 04/08/19 0357  Temp    Pulse 76 04/08/19 0358  Resp 18 04/08/19 0358  SpO2 99 % 04/08/19 0358  Vitals shown include unvalidated device data.  Last Pain:  Vitals:   04/07/19 2004  TempSrc:   PainSc: 0-No pain         Complications: No apparent anesthesia complications

## 2019-04-08 NOTE — Anesthesia Postprocedure Evaluation (Signed)
Anesthesia Post Note  Patient: Evelyn Mcclain  Procedure(s) Performed: SUCTION DILATATION AND CURETTAGE (N/A )     Patient location during evaluation: PACU Anesthesia Type: General Level of consciousness: awake and alert Pain management: pain level controlled Vital Signs Assessment: post-procedure vital signs reviewed and stable Respiratory status: spontaneous breathing, nonlabored ventilation, respiratory function stable and patient connected to nasal cannula oxygen Cardiovascular status: blood pressure returned to baseline and stable Postop Assessment: no apparent nausea or vomiting Anesthetic complications: no    Last Vitals:  Vitals:   04/08/19 0415 04/08/19 0430  BP: 111/67 112/64  Pulse: 81 75  Resp: 15 15  Temp:  36.7 C  SpO2: 100% 100%    Last Pain:  Vitals:   04/08/19 0430  TempSrc:   PainSc: 0-No pain                 Dianey Suchy COKER

## 2019-04-08 NOTE — Anesthesia Preprocedure Evaluation (Addendum)
Anesthesia Evaluation  Patient identified by MRN, date of birth, ID band Patient awake    Reviewed: Allergy & Precautions, NPO status , Patient's Chart, lab work & pertinent test results  Airway Mallampati: II  TM Distance: >3 FB Neck ROM: Full    Dental  (+) Teeth Intact, Dental Advisory Given   Pulmonary Current Smoker,    breath sounds clear to auscultation       Cardiovascular hypertension,  Rhythm:Regular Rate:Normal     Neuro/Psych    GI/Hepatic   Endo/Other    Renal/GU      Musculoskeletal   Abdominal   Peds  Hematology   Anesthesia Other Findings   Reproductive/Obstetrics                            Anesthesia Physical Anesthesia Plan  ASA: II  Anesthesia Plan: General   Post-op Pain Management:    Induction: Intravenous  PONV Risk Score and Plan: Dexamethasone and Ondansetron  Airway Management Planned: LMA  Additional Equipment:   Intra-op Plan:   Post-operative Plan:   Informed Consent: I have reviewed the patients History and Physical, chart, labs and discussed the procedure including the risks, benefits and alternatives for the proposed anesthesia with the patient or authorized representative who has indicated his/her understanding and acceptance.     Dental advisory given  Plan Discussed with: CRNA and Anesthesiologist  Anesthesia Plan Comments:        Anesthesia Quick Evaluation

## 2019-04-10 LAB — SURGICAL PATHOLOGY

## 2019-04-24 ENCOUNTER — Telehealth: Payer: Self-pay | Admitting: Obstetrics & Gynecology

## 2019-04-24 ENCOUNTER — Ambulatory Visit: Payer: Medicaid Other | Admitting: Obstetrics & Gynecology

## 2019-04-24 NOTE — Telephone Encounter (Signed)
Attempted to call patient about her missed appointment. Her voicemail box is not setup on her phone. Therefore I was not able to leave a message.

## 2019-09-25 ENCOUNTER — Inpatient Hospital Stay (HOSPITAL_COMMUNITY)
Admission: AD | Admit: 2019-09-25 | Discharge: 2019-09-25 | Disposition: A | Discharge status: Home / Self Care | Payer: Medicaid Other | Attending: Obstetrics and Gynecology | Admitting: Obstetrics and Gynecology

## 2019-09-25 ENCOUNTER — Other Ambulatory Visit: Payer: Self-pay

## 2019-09-25 ENCOUNTER — Encounter (HOSPITAL_COMMUNITY): Payer: Self-pay | Admitting: Obstetrics and Gynecology

## 2019-09-25 DIAGNOSIS — Z3A21 21 weeks gestation of pregnancy: Secondary | ICD-10-CM

## 2019-09-25 DIAGNOSIS — Z79899 Other long term (current) drug therapy: Secondary | ICD-10-CM | POA: Insufficient documentation

## 2019-09-25 DIAGNOSIS — O99332 Smoking (tobacco) complicating pregnancy, second trimester: Secondary | ICD-10-CM | POA: Insufficient documentation

## 2019-09-25 DIAGNOSIS — F17210 Nicotine dependence, cigarettes, uncomplicated: Secondary | ICD-10-CM | POA: Diagnosis not present

## 2019-09-25 DIAGNOSIS — O212 Late vomiting of pregnancy: Secondary | ICD-10-CM | POA: Diagnosis not present

## 2019-09-25 DIAGNOSIS — Z87440 Personal history of urinary (tract) infections: Secondary | ICD-10-CM | POA: Insufficient documentation

## 2019-09-25 DIAGNOSIS — Z791 Long term (current) use of non-steroidal anti-inflammatories (NSAID): Secondary | ICD-10-CM | POA: Insufficient documentation

## 2019-09-25 DIAGNOSIS — D649 Anemia, unspecified: Secondary | ICD-10-CM | POA: Diagnosis not present

## 2019-09-25 DIAGNOSIS — O219 Vomiting of pregnancy, unspecified: Secondary | ICD-10-CM

## 2019-09-25 DIAGNOSIS — Z3A24 24 weeks gestation of pregnancy: Secondary | ICD-10-CM | POA: Insufficient documentation

## 2019-09-25 DIAGNOSIS — Z882 Allergy status to sulfonamides status: Secondary | ICD-10-CM | POA: Insufficient documentation

## 2019-09-25 DIAGNOSIS — O99012 Anemia complicating pregnancy, second trimester: Secondary | ICD-10-CM

## 2019-09-25 LAB — POCT PREGNANCY, URINE: Preg Test, Ur: POSITIVE — AB

## 2019-09-25 LAB — CBC
HCT: 31.2 % — ABNORMAL LOW (ref 36.0–46.0)
Hemoglobin: 9.9 g/dL — ABNORMAL LOW (ref 12.0–15.0)
MCH: 29.3 pg (ref 26.0–34.0)
MCHC: 31.7 g/dL (ref 30.0–36.0)
MCV: 92.3 fL (ref 80.0–100.0)
Platelets: 208 10*3/uL (ref 150–400)
RBC: 3.38 MIL/uL — ABNORMAL LOW (ref 3.87–5.11)
RDW: 15.9 % — ABNORMAL HIGH (ref 11.5–15.5)
WBC: 7.5 10*3/uL (ref 4.0–10.5)
nRBC: 0 % (ref 0.0–0.2)

## 2019-09-25 LAB — WET PREP, GENITAL
Clue Cells Wet Prep HPF POC: NONE SEEN
Sperm: NONE SEEN
Trich, Wet Prep: NONE SEEN
Yeast Wet Prep HPF POC: NONE SEEN

## 2019-09-25 LAB — URINALYSIS, ROUTINE W REFLEX MICROSCOPIC
Bilirubin Urine: NEGATIVE
Glucose, UA: NEGATIVE mg/dL
Hgb urine dipstick: NEGATIVE
Ketones, ur: NEGATIVE mg/dL
Leukocytes,Ua: NEGATIVE
Nitrite: NEGATIVE
Protein, ur: NEGATIVE mg/dL
Specific Gravity, Urine: 1.02 (ref 1.005–1.030)
pH: 9 — ABNORMAL HIGH (ref 5.0–8.0)

## 2019-09-25 MED ORDER — PROMETHAZINE HCL 25 MG PO TABS: 12.5000 mg | ORAL_TABLET | Freq: Four times a day (QID) | ORAL | 0 refills | Status: DC | PRN

## 2019-09-25 NOTE — Discharge Instructions (Signed)
Abdominal Pain During Pregnancy  Belly (abdominal) pain is common during pregnancy. There are many possible causes. Most of the time, it is not a serious problem. Other times, it can be a sign that something is wrong with the pregnancy. Always tell your doctor if you have belly pain. Follow these instructions at home:  Do not have sex or put anything in your vagina until your pain goes away completely.  Get plenty of rest until your pain gets better.  Drink enough fluid to keep your pee (urine) pale yellow.  Take over-the-counter and prescription medicines only as told by your doctor.  Keep all follow-up visits as told by your doctor. This is important. Contact a doctor if:  Your pain continues or gets worse after resting.  You have lower belly pain that: ? Comes and goes at regular times. ? Spreads to your back. ? Feels like menstrual cramps.  You have pain or burning when you pee (urinate). Get help right away if:  You have a fever or chills.  You have vaginal bleeding.  You are leaking fluid from your vagina.  You are passing tissue from your vagina.  You throw up (vomit) for more than 24 hours.  You have watery poop (diarrhea) for more than 24 hours.  Your baby is moving less than usual.  You feel very weak or faint.  You have shortness of breath.  You have very bad pain in your upper belly. Summary  Belly (abdominal) pain is common during pregnancy. There are many possible causes.  If you have belly pain during pregnancy, tell your doctor right away.  Keep all follow-up visits as told by your doctor. This is important. This information is not intended to replace advice given to you by your health care provider. Make sure you discuss any questions you have with your health care provider. Document Revised: 05/01/2018 Document Reviewed: 04/15/2016 Elsevier Patient Education  2020 [employer] Inc[employer]   Pregnancy and Anemia  Anemia is a condition in which the  concentration of red blood cells, or hemoglobin, in the blood is below normal. Hemoglobin is a substance in red blood cells that carries oxygen to the tissues of the body. Anemia results when enough oxygen does not reach these tissues. Anemia is common during pregnancy because the woman's body needs more blood volume and blood cells to provide nutrition to the fetus. The fetus needs iron and folic acid as it is developing. Your body may not produce enough red blood cells because of this. Also, during pregnancy, the liquid part of the blood (plasma) increases by about 30-50%, and the red blood cells increase by only 20%. This lowers the concentration of the red blood cells and creates a natural anemia-like situation. What are the causes? The most common cause of anemia during pregnancy is not having enough iron in the body to make red blood cells (iron deficiency anemia). Other causes may include:  Folic acid deficiency.  Vitamin B12 deficiency.  Certain prescription or over-the-counter medicines.  Certain medical conditions or infections that destroy red blood cells.  A low platelet count and bleeding caused by antibodies that go through the placenta to the fetus from the mother's blood. What are the signs or symptoms? Mild anemia may not be noticeable. If it becomes severe, symptoms may include:  Feeling tired (fatigue).  Shortness of breath, especially during activity.  Weakness.  Fainting.  Pale looking skin.  Headaches.  A fast or irregular heartbeat (palpitations).  Dizziness. How is this  diagnosed? This condition may be diagnosed based on:  Your medical history and a physical exam.  Blood tests. How is this treated? Treatment for anemia during pregnancy depends on the cause of the anemia. Treatment can include:  Dietary changes.  Supplements of iron, vitamin B12, or folic acid.  A blood transfusion. This may be needed if anemia is severe.  Hospitalization. This  may be needed if there is a lot of blood loss or severe anemia. Follow these instructions at home:  Follow recommendations from your dietitian or health care provider about changing your diet.  Increase your vitamin C intake. This will help the stomach absorb more iron. Some foods that are high in vitamin C include: ? Oranges. ? Peppers. ? Tomatoes. ? Mangoes.  Eat a diet rich in iron. This would include foods such as: ? Liver. ? Beef. ? Eggs. ? Whole grains. ? Spinach. ? Dried fruit.  Take iron and vitamins as told by your health care provider.  Eat green leafy vegetables. These are a good source of folic acid.  Keep all follow-up visits as told by your health care provider. This is important. Contact a health care provider if:  You have frequent or lasting headaches.  You look pale.  You bruise easily. Get help right away if:  You have extreme weakness, shortness of breath, or chest pain.  You become dizzy or have trouble concentrating.  You have heavy vaginal bleeding.  You develop a rash.  You have bloody or black, tarry stools.  You faint.  You vomit up blood.  You vomit repeatedly.  You have abdominal pain.  You have a fever.  You are dehydrated. Summary  Anemia is a condition in which the concentration of red blood cells or hemoglobin in the blood is below normal.  Anemia is common during pregnancy because the woman's body needs more blood volume and blood cells to provide nutrition to the fetus.  The most common cause of anemia during pregnancy is not having enough iron in the body to make red blood cells (iron deficiency anemia).  Mild anemia may not be noticeable. If it becomes severe, symptoms may include feeling tired and weak. This information is not intended to replace advice given to you by your health care provider. Make sure you discuss any questions you have with your health care provider. Document Revised: 06/27/2018 Document  Reviewed: 02/17/2016 Elsevier Patient Education  2020 [employer].

## 2019-09-25 NOTE — MAU Note (Signed)
Hasn't really had a period since last preg, (took pills for abortion, ended up with D&C due to hemorrhage). had some spotting the month after.  Has not done preg test.  First month was pretty sick. Just wanting to make sure everything is ok. Had a little pain this morning, went away after she ate.  Still feels a little nauseated, has not throw up today.

## 2019-09-25 NOTE — MAU Provider Note (Signed)
History     CSN: [id number]  Arrival date and time: 09/25/19 1340   First Provider Initiated Contact with Patient 09/25/19 1433      Chief Complaint  Patient presents with  . Nausea  . Possible Pregnancy   24 y.o. [id number] @24 .2 by unsure LMP presenting with LAP. Pain started this am but she has had it for the last 1-2 months. Pain located in lower abdomen. Improves after she eats. Reports poor appetite and admits she has been in denial about this pregnancy. Denies fevers. No VB but reports thick white vaginal discharge. She is unsure if she has felt FM yet. Has occasional N/V if she gets hot.    OB History    Gravida  4   Para  1   Term      Preterm  1   AB  2   Living  1     SAB  0   TAB  2   Ectopic      Multiple  0   Live Births  1           Past Medical History:  Diagnosis Date  . Anemia   . Chlamydia   . Heart murmur   . Hx of seasonal allergies   . Infection    uti  . Pregnancy induced hypertension   . Subarachnoid hemorrhage following injury (HCC) 08/30/2011  . Subdural hematoma, 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/2011    Past Surgical History:  Procedure Laterality Date  . DILATION AND CURETTAGE OF UTERUS N/A 04/07/2019   Procedure: SUCTION DILATATION AND CURETTAGE;  Surgeon: 04/09/2019, MD;  Location: [facility] OR;  Service: Gynecology;  Laterality: N/A;  . NO PAST SURGERIES      Family History  Problem Relation Age of Onset  . Hypertension Mother   . Hypertension Maternal Aunt   . Hypertension Maternal Uncle   . Hypertension Maternal Grandmother   . Asthma Maternal Grandmother   . Hypertension Maternal Grandfather   . Diabetes Paternal Grandmother   . Diabetes Paternal Grandfather   . Asthma Brother     Social History   Tobacco Use  . Smoking status: Current Some Day Smoker    Packs/day: 0.00    Types: Cigarettes  . Smokeless tobacco: Never Used  Substance Use Topics  . Alcohol use: No  . Drug use: Not Currently    Types: Marijuana     Comment: June 2019    Allergies:  Allergies  Allergen Reactions  . Septra [Bactrim] Rash    Medications Prior to Admission  Medication Sig Dispense Refill Last Dose  . amoxicillin (AMOXIL) 875 MG tablet Take 1 tablet (875 mg total) by mouth 2 (two) times daily. 20 tablet 0   . ferrous sulfate 325 (65 FE) MG tablet Take 1 tablet (325 mg total) by mouth daily with breakfast. 325 tablet 3 More than a month at Unknown time  . HYDROcodone-acetaminophen (NORCO/VICODIN) 5-325 MG tablet Take 1 tablet by mouth every 6 (six) hours as needed for severe pain. (Patient not taking: Reported on 04/02/2019) 6 tablet 0   . ibuprofen (ADVIL) 800 MG tablet Take 1 tablet (800 mg total) by mouth 3 (three) times daily. 21 tablet 0     Review of Systems  Constitutional: Negative for chills and fever.  Gastrointestinal: Positive for abdominal pain, nausea and vomiting. Negative for constipation and diarrhea.  Genitourinary: Negative for dysuria, frequency, pelvic pain, vaginal bleeding and vaginal discharge.   Physical Exam   Blood pressure  134/71, pulse 78, temperature 99.3 F (37.4 C), temperature source Oral, resp. rate 18, height 5\' 7"  (1.702 m), weight 69 kg, last menstrual period 04/08/2019, SpO2 100 %, unknown if currently breastfeeding.  Physical Exam Vitals and nursing note reviewed. Exam conducted with a chaperone present.  Constitutional:      General: She is not in acute distress.    Appearance: Normal appearance.  HENT:     Head: Normocephalic and atraumatic.  Cardiovascular:     Rate and Rhythm: Normal rate.  Pulmonary:     Effort: Pulmonary effort is normal. No respiratory distress.  Abdominal:     General: There is no distension.     Palpations: Abdomen is soft.     Tenderness: There is no abdominal tenderness.     Comments: Fundus 1/U  Genitourinary:    Comments: VE: closed/long Musculoskeletal:        General: Normal range of motion.     Cervical back: Normal range of  motion.  Skin:    General: Skin is warm and dry.  Neurological:     General: No focal deficit present.     Mental Status: She is alert and oriented to person, place, and time.  Psychiatric:        Behavior: Behavior normal.     Comments: tearful   FHT: 155  Results for orders placed or performed during the hospital encounter of 09/25/19 (from the past 24 hour(s))  Pregnancy, urine POC     Status: Abnormal   Collection Time: 09/25/19  2:07 PM  Result Value Ref Range   Preg Test, Ur POSITIVE (A) NEGATIVE  Urinalysis, Routine w reflex microscopic Urine, Clean Catch     Status: Abnormal   Collection Time: 09/25/19  2:12 PM  Result Value Ref Range   Color, Urine YELLOW YELLOW   APPearance CLOUDY (A) CLEAR   Specific Gravity, Urine 1.020 1.005 - 1.030   pH 9.0 (H) 5.0 - 8.0   Glucose, UA NEGATIVE NEGATIVE mg/dL   Hgb urine dipstick NEGATIVE NEGATIVE   Bilirubin Urine NEGATIVE NEGATIVE   Ketones, ur NEGATIVE NEGATIVE mg/dL   Protein, ur NEGATIVE NEGATIVE mg/dL   Nitrite NEGATIVE NEGATIVE   Leukocytes,Ua NEGATIVE NEGATIVE  Wet prep, genital     Status: Abnormal   Collection Time: 09/25/19  2:45 PM   Specimen: Vaginal  Result Value Ref Range   Yeast Wet Prep HPF POC NONE SEEN NONE SEEN   Trich, Wet Prep NONE SEEN NONE SEEN   Clue Cells Wet Prep HPF POC NONE SEEN NONE SEEN   WBC, Wet Prep HPF POC FEW (A) NONE SEEN   Sperm NONE SEEN    MAU Course  Procedures  MDM Labs ordered and reviewed. Pt is approx 21 wks by fundal height. No signs of PTL. Will treat anemia and nausea. Pt plans to get [facility]. Stable for discharge home.   Assessment and Plan   1. [redacted] weeks gestation of pregnancy   2. Anemia during pregnancy in second trimester   3. Nausea and vomiting in pregnancy    Discharge home Follow up at [facility] to start care PTL precautions Fe po QOD- has at home  Allergies as of 09/25/2019      Reactions   Septra [bactrim] Rash      Medication List    STOP taking these  medications   amoxicillin 875 MG tablet Commonly known as: AMOXIL   HYDROcodone-acetaminophen 5-325 MG tablet Commonly known as: NORCO/VICODIN   ibuprofen  800 MG tablet Commonly known as: ADVIL     TAKE these medications   ferrous sulfate 325 (65 FE) MG tablet Take 1 tablet (325 mg total) by mouth daily with breakfast.   promethazine 25 MG tablet Commonly known as: PHENERGAN Take 0.5-1 tablets (12.5-25 mg total) by mouth every 6 (six) hours as needed for nausea or vomiting.      Donette Larry, CNM 09/25/2019, 2:50 PM

## 2019-09-26 LAB — GC/CHLAMYDIA PROBE AMP (~~LOC~~) NOT AT ARMC
Chlamydia: NEGATIVE
Comment: NEGATIVE
Comment: NORMAL
Neisseria Gonorrhea: NEGATIVE

## 2019-10-08 ENCOUNTER — Telehealth: Payer: Self-pay | Admitting: Certified Nurse Midwife

## 2019-10-08 NOTE — Telephone Encounter (Signed)
Called pt; VM not set up. MyChart message sent. Will attempt phone call one more time.

## 2019-10-08 NOTE — Telephone Encounter (Signed)
Patient is requesting a call back. She feels like she needs an ultrasound.

## 2019-10-09 ENCOUNTER — Other Ambulatory Visit (HOSPITAL_COMMUNITY)
Admission: RE | Admit: 2019-10-09 | Discharge: 2019-10-09 | Disposition: A | Discharge status: Home / Self Care | Payer: Medicaid Other | Source: Ambulatory Visit | Attending: Certified Nurse Midwife | Admitting: Certified Nurse Midwife

## 2019-10-09 ENCOUNTER — Encounter: Payer: Self-pay | Admitting: Certified Nurse Midwife

## 2019-10-09 ENCOUNTER — Ambulatory Visit (INDEPENDENT_AMBULATORY_CARE_PROVIDER_SITE_OTHER): Payer: Medicaid Other | Admitting: Certified Nurse Midwife

## 2019-10-09 ENCOUNTER — Other Ambulatory Visit: Payer: Self-pay

## 2019-10-09 VITALS — BP 128/74 | HR 78 | Wt 152.0 lb

## 2019-10-09 DIAGNOSIS — N949 Unspecified condition associated with female genital organs and menstrual cycle: Secondary | ICD-10-CM

## 2019-10-09 DIAGNOSIS — Z23 Encounter for immunization: Secondary | ICD-10-CM | POA: Diagnosis not present

## 2019-10-09 DIAGNOSIS — Z3A26 26 weeks gestation of pregnancy: Secondary | ICD-10-CM

## 2019-10-09 DIAGNOSIS — O0990 Supervision of high risk pregnancy, unspecified, unspecified trimester: Secondary | ICD-10-CM | POA: Diagnosis not present

## 2019-10-09 LAB — POCT URINALYSIS DIP (DEVICE)
Bilirubin Urine: NEGATIVE
Bilirubin Urine: NEGATIVE
Glucose, UA: NEGATIVE mg/dL
Glucose, UA: NEGATIVE mg/dL
Ketones, ur: NEGATIVE mg/dL
Ketones, ur: NEGATIVE mg/dL
Leukocytes,Ua: NEGATIVE
Leukocytes,Ua: NEGATIVE
Nitrite: NEGATIVE
Nitrite: NEGATIVE
Protein, ur: NEGATIVE mg/dL
Protein, ur: NEGATIVE mg/dL
Specific Gravity, Urine: >=1.03 (ref 1.005–1.030)
Specific Gravity, Urine: >=1.03 (ref 1.005–1.030)
Urobilinogen, UA: 0.2 mg/dL (ref 0.0–1.0)
Urobilinogen, UA: 0.2 mg/dL (ref 0.0–1.0)
pH: 6.5 (ref 5.0–8.0)
pH: 7 (ref 5.0–8.0)

## 2019-10-09 MED ORDER — COMFORT FIT MATERNITY SUPP SM MISC: 1.0000 [IU] | Freq: Every day | 0 refills | Status: DC | PRN

## 2019-10-09 MED ORDER — BLOOD PRESSURE KIT DEVI: 1.0000 | Freq: Every day | 0 refills | Status: DC

## 2019-10-09 MED ORDER — PRENATAL PLUS 27-1 MG PO TABS: 1.0000 | ORAL_TABLET | Freq: Every day | ORAL | 5 refills | Status: DC

## 2019-10-09 MED ORDER — PREPLUS 27-1 MG PO TABS: 1.0000 | ORAL_TABLET | Freq: Every day | ORAL | 13 refills | Status: DC

## 2019-10-09 MED ORDER — MAG-OXIDE 200 MG PO TABS: 400.0000 mg | ORAL_TABLET | Freq: Every day | ORAL | 3 refills | Status: DC

## 2019-10-09 NOTE — Progress Notes (Signed)
28 week and New OB packet given to patient.   Ladona Ridgel, RN

## 2019-10-09 NOTE — Progress Notes (Signed)
PRENATAL VISIT NOTE  Subjective:  Evelyn Mcclain is a 24 y.o. [phone] at [redacted]w[redacted]d being seen today for her first prenatal visit for this pregnancy.  She is currently monitored for the following issues for this high-risk pregnancy and has Polysubstance abuse (HCC); Supervision of normal first pregnancy, antepartum; Marijuana use; UTI (urinary tract infection) during pregnancy, second trimester; Preeclampsia; HELLP syndrome, delivered, current hospitalization; Incomplete abortion; and Supervision of high risk pregnancy, antepartum on their problem list.  Patient reports a lot of intermittent pain with movement in her lower abdomen. She is very anxious and tearful since she has waited so long to seek [facility]. She was unsure of her dating and desire to keep the pregnancy and is now terrified something is wrong with the baby. She also has a history of PTL/B and is afraid this will happen again..  Contractions: Not present. Vag. Bleeding: None.  Movement: Present. Denies leaking of fluid.   She is planning to bottle feed. Desires Depo-Provera for contraception.   The following portions of the patient's history were reviewed and updated as appropriate: allergies, current medications, past family history, past medical history, past social history, past surgical history and problem list.   Objective:   Vitals:   10/09/19 0921  BP: 128/74  Pulse: 78  Weight: 152 lb (68.9 kg)    Fetal Status: Fetal Heart Rate (bpm): 142 Fundal Height: 26 cm Movement: Present    Bedside ultrasound done by Steward Drone, CNM to help ease patient's anxiety - she just needed to see the baby. Able to continue with visit once we confirmed that baby was alive and moving around well. Formal ultrasound ordered.  General:  Alert, oriented and cooperative. Patient is in no acute distress.  Skin: Skin is warm and dry. No rash noted.   Cardiovascular: Normal heart rate and rhythm noted  Respiratory: Normal respiratory effort, no  problems with respiration noted. Clear to auscultation.   Abdomen: Soft, gravid, appropriate for gestational age. Normal bowel sounds. Non-tender. Pain/Pressure: Present     Pelvic: Cervical exam deferred       Normal cervical contour, no lesions, no bleeding following pap, normal discharge  Extremities: Normal range of motion.     Mental Status: Normal mood and affect. Normal behavior. Normal judgment and thought content.   Assessment and Plan:  Pregnancy: [id number] at [redacted]w[redacted]d 1. Supervision of high risk pregnancy, antepartum - CBC/D/Plt+RPR+Rh+ABO+Rub Ab... - Culture, OB Urine - Hemoglobin A1c - CHL AMB BABYSCRIPTS OPT IN - [place] MFM OB DETAIL +14 WK; Future - STI swabs deferred, just done in MAU on 8/31, no new sexual partners and pt declined - Discussed the normal visit cadence for prenatal care - Discussed the nature of our practice with multiple providers including residents and students   2. [redacted] weeks gestation of pregnancy - Gave general reassurance about her pregnancy and advised close follow up, good nutrition, stress management, and prenatal vitamins. Pt had calmed after ultrasound and was able to discuss and agree to plan of care. - Gave anticipatory guidance about next visit and GTT testing  3. Round ligament pain - Explained round ligament pain and possibility of scar tissue from previous surgical procedures, gave reassurance and offered flexeril or magnesium (pt prefers magnesium) and a maternity belt for comfort.  - Elastic Bandages & Supports (COMFORT FIT MATERNITY SUPP SM) MISC; 1 Units by Does not apply route daily as needed.  Dispense: 1 each; Refill: 0 - Magnesium Oxide (MAG-OXIDE) 200 MG TABS; Take 2  tablets (400 mg total) by mouth at bedtime. If that amount causes loose stools in the am, switch to 200mg  daily at bedtime.  Dispense: 60 tablet; Refill: 3  Preterm labor/first trimester warning symptoms and general obstetric precautions including but not limited to vaginal  bleeding, contractions, leaking of fluid and fetal movement were reviewed in detail with the patient. Please refer to After Visit Summary for other counseling recommendations.   Future Appointments  Date Time Provider Department Center  10/19/2019  8:00 AM [facility] NURSE [facility]  10/19/2019  8:15 AM [facility] US2 [facility]  10/23/2019 10:35 AM 10/25/2019, CNM [facility]   [facility] CNM, MSN, [facility] 10/09/19 11:33 AM

## 2019-10-10 LAB — HCV INTERPRETATION

## 2019-10-10 LAB — CBC/D/PLT+RPR+RH+ABO+RUB AB...
Antibody Screen: NEGATIVE
Basophils Absolute: 0 10*3/uL (ref 0.0–0.2)
Basos: 0 %
EOS (ABSOLUTE): 0.1 10*3/uL (ref 0.0–0.4)
Eos: 1 %
HCV Ab: 0.1 s/co ratio (ref 0.0–0.9)
HIV Screen 4th Generation wRfx: NONREACTIVE
Hematocrit: 32.5 % — ABNORMAL LOW (ref 34.0–46.6)
Hemoglobin: 10.5 g/dL — ABNORMAL LOW (ref 11.1–15.9)
Hepatitis B Surface Ag: NEGATIVE
Immature Grans (Abs): 0 10*3/uL (ref 0.0–0.1)
Immature Granulocytes: 1 %
Lymphocytes Absolute: 1 10*3/uL (ref 0.7–3.1)
Lymphs: 18 %
MCH: 29.2 pg (ref 26.6–33.0)
MCHC: 32.3 g/dL (ref 31.5–35.7)
MCV: 90 fL (ref 79–97)
Monocytes Absolute: 0.4 10*3/uL (ref 0.1–0.9)
Monocytes: 6 %
Neutrophils Absolute: 4.3 10*3/uL (ref 1.4–7.0)
Neutrophils: 74 %
Platelets: 210 10*3/uL (ref 150–450)
RBC: 3.6 x10E6/uL — ABNORMAL LOW (ref 3.77–5.28)
RDW: 14.6 % (ref 11.7–15.4)
RPR Ser Ql: NONREACTIVE
Rh Factor: POSITIVE
Rubella Antibodies, IGG: 5.65 index (ref >0.99)
WBC: 5.7 10*3/uL (ref 3.4–10.8)

## 2019-10-10 LAB — GC/CHLAMYDIA PROBE AMP (~~LOC~~) NOT AT ARMC
Chlamydia: NEGATIVE
Comment: NEGATIVE
Comment: NORMAL
Neisseria Gonorrhea: NEGATIVE

## 2019-10-10 LAB — HEMOGLOBIN A1C
Est. average glucose Bld gHb Est-mCnc: 100 mg/dL
Hgb A1c MFr Bld: 5.1 % (ref 4.8–5.6)

## 2019-10-10 NOTE — Addendum Note (Signed)
Addended by: Olene Craven B on: 10/10/2019 03:32 PM   Modules accepted: Orders

## 2019-10-19 ENCOUNTER — Ambulatory Visit: Payer: Medicaid Other | Attending: Certified Nurse Midwife

## 2019-10-19 ENCOUNTER — Other Ambulatory Visit: Payer: Self-pay | Admitting: *Deleted

## 2019-10-19 ENCOUNTER — Ambulatory Visit: Payer: Medicaid Other

## 2019-10-19 ENCOUNTER — Other Ambulatory Visit: Payer: Self-pay

## 2019-10-19 DIAGNOSIS — O0990 Supervision of high risk pregnancy, unspecified, unspecified trimester: Secondary | ICD-10-CM

## 2019-10-19 DIAGNOSIS — O0930 Supervision of pregnancy with insufficient antenatal care, unspecified trimester: Secondary | ICD-10-CM

## 2019-10-23 ENCOUNTER — Encounter: Payer: Medicaid Other | Admitting: Certified Nurse Midwife

## 2019-10-25 ENCOUNTER — Encounter: Payer: Medicaid Other | Admitting: Obstetrics and Gynecology

## 2019-10-25 ENCOUNTER — Encounter (INDEPENDENT_AMBULATORY_CARE_PROVIDER_SITE_OTHER): Payer: Medicaid Other | Admitting: Obstetrics and Gynecology

## 2019-10-25 NOTE — Progress Notes (Signed)
Called patient for check in of appt today and patient reports she just arrived at the [facility] and cannot do appt at the moment- patient would like appt rescheduled. Patient states she didn't finish the medication for the BV infection but needs another prescription sent. Asked patient to tell me more about the symptoms she is having but she was out in public and couldn't talk at the moment. Asked patient to send detailed mychart message of symptoms so we can provide further assistance. Patient verbalized understanding.  Chase Caller RN BSN 10/25/19

## 2019-10-26 NOTE — Progress Notes (Signed)
Evelyn Mcclain did not keep her appt today.   Venia Carbon I, NP 10/26/19 8:04 AM

## 2019-11-05 ENCOUNTER — Encounter: Payer: Medicaid Other | Admitting: Student

## 2019-11-16 ENCOUNTER — Ambulatory Visit: Payer: Medicaid Other

## 2019-11-21 ENCOUNTER — Other Ambulatory Visit: Payer: Self-pay

## 2019-11-21 ENCOUNTER — Encounter: Payer: Self-pay | Admitting: *Deleted

## 2019-11-21 ENCOUNTER — Ambulatory Visit: Payer: Medicaid Other | Attending: Obstetrics and Gynecology

## 2019-11-21 ENCOUNTER — Ambulatory Visit: Payer: Medicaid Other | Admitting: *Deleted

## 2019-11-21 ENCOUNTER — Other Ambulatory Visit: Payer: Self-pay | Admitting: *Deleted

## 2019-11-21 DIAGNOSIS — O0990 Supervision of high risk pregnancy, unspecified, unspecified trimester: Secondary | ICD-10-CM | POA: Insufficient documentation

## 2019-11-21 DIAGNOSIS — Z3A25 25 weeks gestation of pregnancy: Secondary | ICD-10-CM

## 2019-11-21 DIAGNOSIS — O99322 Drug use complicating pregnancy, second trimester: Secondary | ICD-10-CM | POA: Diagnosis not present

## 2019-11-21 DIAGNOSIS — F1910 Other psychoactive substance abuse, uncomplicated: Secondary | ICD-10-CM

## 2019-11-21 DIAGNOSIS — Z362 Encounter for other antenatal screening follow-up: Secondary | ICD-10-CM | POA: Diagnosis not present

## 2019-11-21 DIAGNOSIS — O0930 Supervision of pregnancy with insufficient antenatal care, unspecified trimester: Secondary | ICD-10-CM | POA: Diagnosis present

## 2019-11-21 DIAGNOSIS — Z3687 Encounter for antenatal screening for uncertain dates: Secondary | ICD-10-CM | POA: Diagnosis not present

## 2019-12-02 ENCOUNTER — Encounter (HOSPITAL_COMMUNITY): Payer: Self-pay | Admitting: Obstetrics & Gynecology

## 2019-12-02 ENCOUNTER — Other Ambulatory Visit: Payer: Self-pay

## 2019-12-02 ENCOUNTER — Inpatient Hospital Stay (HOSPITAL_COMMUNITY)
Admission: AD | Admit: 2019-12-02 | Discharge: 2019-12-02 | Disposition: A | Discharge status: Home / Self Care | Payer: Medicaid Other | Attending: Obstetrics & Gynecology | Admitting: Obstetrics & Gynecology

## 2019-12-02 DIAGNOSIS — F17210 Nicotine dependence, cigarettes, uncomplicated: Secondary | ICD-10-CM | POA: Insufficient documentation

## 2019-12-02 DIAGNOSIS — Z634 Disappearance and death of family member: Secondary | ICD-10-CM | POA: Insufficient documentation

## 2019-12-02 DIAGNOSIS — O0990 Supervision of high risk pregnancy, unspecified, unspecified trimester: Secondary | ICD-10-CM

## 2019-12-02 DIAGNOSIS — Z3492 Encounter for supervision of normal pregnancy, unspecified, second trimester: Secondary | ICD-10-CM

## 2019-12-02 DIAGNOSIS — O09292 Supervision of pregnancy with other poor reproductive or obstetric history, second trimester: Secondary | ICD-10-CM | POA: Insufficient documentation

## 2019-12-02 DIAGNOSIS — Z8759 Personal history of other complications of pregnancy, childbirth and the puerperium: Secondary | ICD-10-CM | POA: Diagnosis not present

## 2019-12-02 DIAGNOSIS — R609 Edema, unspecified: Secondary | ICD-10-CM | POA: Diagnosis present

## 2019-12-02 DIAGNOSIS — O1202 Gestational edema, second trimester: Secondary | ICD-10-CM | POA: Diagnosis not present

## 2019-12-02 DIAGNOSIS — O99332 Smoking (tobacco) complicating pregnancy, second trimester: Secondary | ICD-10-CM | POA: Diagnosis not present

## 2019-12-02 DIAGNOSIS — Z3A26 26 weeks gestation of pregnancy: Secondary | ICD-10-CM | POA: Diagnosis not present

## 2019-12-02 LAB — URINALYSIS, ROUTINE W REFLEX MICROSCOPIC
Bacteria, UA: NONE SEEN
Bilirubin Urine: NEGATIVE
Glucose, UA: NEGATIVE mg/dL
Ketones, ur: NEGATIVE mg/dL
Leukocytes,Ua: NEGATIVE
Nitrite: NEGATIVE
Protein, ur: NEGATIVE mg/dL
Specific Gravity, Urine: 1.024 (ref 1.005–1.030)
pH: 6 (ref 5.0–8.0)

## 2019-12-02 NOTE — MAU Provider Note (Signed)
History     CSN: [id number]  Arrival date and time: 12/02/19 4098   First Provider Initiated Contact with Patient 12/02/19 [phone]      Chief Complaint  Patient presents with  . Other    hand, foot swelling    HPI Evelyn Mcclain is a 24 y.o. [phone] at 33w4dwho presents to MAU with chief complaint of bilateral swelling in her hands and feet.  This is a recurrent problem. Patient states these symptoms are very concerning because Evelyn Mcclain has a history of preeclampsia and no way to monitor her blood pressure at home. Evelyn Mcclain states Evelyn Mcclain has not had a chance to pick up her blood pressure cuff. Evelyn Mcclain denies unilateral swelling, calf pain, discoloration.  Patient states today is the one month anniversary of her partner's murder. Evelyn Mcclain is tearful and feeling overwhelmed as he was very involved in her previous pregnancy.  Evelyn Mcclain denies pain,  vaginal bleeding, leaking of fluid, decreased fetal movement, fever, falls, or recent illness.    OB History    Gravida  4   Para  1   Term  0   Preterm  1   AB  2   Living  1     SAB  0   TAB  2   Ectopic  0   Multiple  0   Live Births  1           Past Medical History:  Diagnosis Date  . Anemia   . Chlamydia   . Heart murmur   . Hx of seasonal allergies   . Infection    uti  . Pregnancy induced hypertension   . Subarachnoid hemorrhage following [place] ([facility] 08/30/2011  . Subdural hematoma, post-traumatic ([facility] 08/30/2011    Past Surgical History:  Procedure Laterality Date  . DILATION AND CURETTAGE OF UTERUS N/A 04/07/2019   Procedure: SUCTION DILATATION AND CURETTAGE;  Surgeon: PDebbrah Alar MD;  [place] [facility]  Service: Gynecology;  Laterality: N/A;  . NO PAST SURGERIES      Family History  Problem Relation Age of Onset  . Hypertension Mother   . Hypertension Maternal Aunt   . Hypertension Maternal Uncle   . Hypertension Maternal Grandmother   . Asthma Maternal Grandmother   . Hypertension Maternal Grandfather   .  Diabetes Paternal Grandmother   . Diabetes Paternal Grandfather   . Asthma Brother     Social History   Tobacco Use  . Smoking status: Current Some Day Smoker    Packs/day: 0.00    Types: Cigarettes  . Smokeless tobacco: Never Used  Substance Use Topics  . Alcohol use: No  . Drug use: Not Currently    Types: Marijuana    Comment: June 2019    Allergies:  Allergies  Allergen Reactions  . Septra [Bactrim] Rash    Medications Prior to Admission  Medication Sig Dispense Refill Last Dose  . Blood Pressure Monitoring (BLOOD PRESSURE KIT) DEVI 1 Device by Does not apply route daily. 1 each 0   . ferrous sulfate 325 (65 FE) MG tablet Take 1 tablet (325 mg total) by mouth daily with breakfast. 325 tablet 3   . Magnesium Oxide (MAG-OXIDE) 200 MG TABS Take 2 tablets (400 mg total) by mouth at bedtime. If that amount causes loose stools in the am, switch to 2037mdaily at bedtime. 60 tablet 3   . promethazine (PHENERGAN) 25 MG tablet Take 0.5-1 tablets (12.5-25 mg total) by mouth every 6 (six) hours as  needed for nausea or vomiting. (Patient not taking: Reported on 11/21/2019) 30 tablet 0     Review of Systems  Gastrointestinal: Negative for abdominal pain.  Genitourinary: Negative for vaginal bleeding and vaginal discharge.  Musculoskeletal: Negative for back pain.  All other systems reviewed and are negative.  Physical Exam   Blood pressure 122/73, pulse 75, temperature 98.5 F (36.9 C), temperature source Oral, resp. rate 16, last menstrual period 04/08/2019, SpO2 99 %, unknown if currently breastfeeding.  Physical Exam Vitals and nursing note reviewed. Exam conducted with a chaperone present.  Cardiovascular:     Rate and Rhythm: Normal rate.     Pulses: Normal pulses.  Pulmonary:     Effort: Pulmonary effort is normal.  Abdominal:     Comments: Gravid  Musculoskeletal:     Right lower leg: No edema.     Left lower leg: No edema.  Skin:    General: Skin is warm and  dry.     Capillary Refill: Capillary refill takes less than 2 seconds.  Neurological:     General: No focal deficit present.     Mental Status: Evelyn Mcclain is alert.  Psychiatric:        Mood and Affect: Mood normal.        Thought Content: Thought content normal.        Judgment: Judgment normal.     MAU Course  Procedures  --Reassuring fetal tracing: baseline 140, mod var, 10 x 10 accels, no decels --Toco: quiet --Reassured of normotensive status --Offered one hour of BP monitoring for reassurance. Pt declines --Trace edema bilaterally in lower extremities  Patient Vitals for the past 24 hrs:  BP Temp Temp src Pulse Resp SpO2  12/02/19 0745 123/69 -- -- 67 -- --  12/02/19 0722 -- 98.5 F (36.9 C) Oral -- 16 --  12/02/19 0720 122/73 -- -- 75 -- 99 %     Assessment and Plan  --24 y.o. [id number] at [redacted]w[redacted]d --Reactive tracing --Normotensive -- Trace edema c/w gestational age --Discharge home in stable condition  SDarlina Rumpf CNorth [place]11/07/2019, 8:06 AM

## 2019-12-02 NOTE — MAU Note (Signed)
Pt declines speaking to the chaplain at this time

## 2019-12-02 NOTE — Discharge Instructions (Signed)

## 2019-12-02 NOTE — MAU Note (Signed)
Pt reports to mau with c/o swelling in her hands and feet.  Pt reports hx of pre e in last pregnancy.  Pt verbalizing that she is extremely stressed due to the murder of her FOB last month.  Pt denies any pain this time.  Denies visual changes or headache. Denies LOF

## 2020-01-16 ENCOUNTER — Ambulatory Visit: Payer: Medicaid Other

## 2020-01-26 NOTE — L&D Delivery Note (Signed)
Delivery Note At 1003 a viable female infant was delivered via SVD, presentation: LOA. APGAR: 6, 10; weight pending.   Placenta status: spontaneously delivered intact with gentle cord traction. Fundus firm with massage and Pitocin.   Anesthesia: epidural Lacerations: right labial, superficial, hemostatic Est. Blood Loss (mL): 5 Placenta to LD Complications none Cord ph n/a   Mom to postpartum. Baby to Couplet care / Skin to Skin.    Donette Larry, CNM 02/01/2020 10:33 AM

## 2020-01-30 ENCOUNTER — Ambulatory Visit (INDEPENDENT_AMBULATORY_CARE_PROVIDER_SITE_OTHER): Payer: Medicaid Other | Admitting: Certified Nurse Midwife

## 2020-01-30 ENCOUNTER — Inpatient Hospital Stay (HOSPITAL_COMMUNITY)
Admission: AD | Admit: 2020-01-30 | Discharge: 2020-02-03 | DRG: 807 | Disposition: A | Discharge status: Home / Self Care | Payer: Medicaid Other | Attending: Obstetrics and Gynecology | Admitting: Obstetrics and Gynecology

## 2020-01-30 ENCOUNTER — Encounter (HOSPITAL_COMMUNITY): Payer: Self-pay | Admitting: Obstetrics & Gynecology

## 2020-01-30 ENCOUNTER — Other Ambulatory Visit: Payer: Self-pay

## 2020-01-30 VITALS — BP 190/103 | HR 91 | Wt 183.1 lb

## 2020-01-30 DIAGNOSIS — Z3A35 35 weeks gestation of pregnancy: Secondary | ICD-10-CM

## 2020-01-30 DIAGNOSIS — D649 Anemia, unspecified: Secondary | ICD-10-CM | POA: Diagnosis present

## 2020-01-30 DIAGNOSIS — Z20822 Contact with and (suspected) exposure to covid-19: Secondary | ICD-10-CM | POA: Diagnosis present

## 2020-01-30 DIAGNOSIS — O163 Unspecified maternal hypertension, third trimester: Secondary | ICD-10-CM

## 2020-01-30 DIAGNOSIS — O1414 Severe pre-eclampsia complicating childbirth: Principal | ICD-10-CM | POA: Diagnosis present

## 2020-01-30 DIAGNOSIS — R030 Elevated blood-pressure reading, without diagnosis of hypertension: Secondary | ICD-10-CM | POA: Diagnosis present

## 2020-01-30 DIAGNOSIS — O1413 Severe pre-eclampsia, third trimester: Secondary | ICD-10-CM

## 2020-01-30 DIAGNOSIS — O99334 Smoking (tobacco) complicating childbirth: Secondary | ICD-10-CM | POA: Diagnosis present

## 2020-01-30 DIAGNOSIS — F17210 Nicotine dependence, cigarettes, uncomplicated: Secondary | ICD-10-CM | POA: Diagnosis present

## 2020-01-30 DIAGNOSIS — O99324 Drug use complicating childbirth: Secondary | ICD-10-CM | POA: Diagnosis present

## 2020-01-30 DIAGNOSIS — O0990 Supervision of high risk pregnancy, unspecified, unspecified trimester: Secondary | ICD-10-CM

## 2020-01-30 DIAGNOSIS — Z3400 Encounter for supervision of normal first pregnancy, unspecified trimester: Secondary | ICD-10-CM

## 2020-01-30 DIAGNOSIS — O99013 Anemia complicating pregnancy, third trimester: Secondary | ICD-10-CM | POA: Insufficient documentation

## 2020-01-30 DIAGNOSIS — O9902 Anemia complicating childbirth: Secondary | ICD-10-CM | POA: Diagnosis present

## 2020-01-30 DIAGNOSIS — F1290 Cannabis use, unspecified, uncomplicated: Secondary | ICD-10-CM | POA: Diagnosis present

## 2020-01-30 LAB — RAPID URINE DRUG SCREEN, HOSP PERFORMED
Amphetamines: NOT DETECTED
Barbiturates: NOT DETECTED
Benzodiazepines: NOT DETECTED
Cocaine: NOT DETECTED
Opiates: NOT DETECTED
Tetrahydrocannabinol: POSITIVE — AB

## 2020-01-30 LAB — RESP PANEL BY RT-PCR (FLU A&B, COVID) ARPGX2
Influenza A by PCR: NEGATIVE
Influenza B by PCR: NEGATIVE
SARS Coronavirus 2 by RT PCR: NEGATIVE

## 2020-01-30 LAB — PROTEIN / CREATININE RATIO, URINE
Creatinine, Urine: 230.44 mg/dL
Protein Creatinine Ratio: 0.07 mg/mg{Cre} (ref 0.00–0.15)
Total Protein, Urine: 17 mg/dL

## 2020-01-30 LAB — COMPREHENSIVE METABOLIC PANEL
ALT: 11 U/L (ref 0–44)
AST: 17 U/L (ref 15–41)
Albumin: 2.6 g/dL — ABNORMAL LOW (ref 3.5–5.0)
Alkaline Phosphatase: 119 U/L (ref 38–126)
Anion gap: 8 (ref 5–15)
BUN: 7 mg/dL (ref 6–20)
CO2: 20 mmol/L — ABNORMAL LOW (ref 22–32)
Calcium: 8.3 mg/dL — ABNORMAL LOW (ref 8.9–10.3)
Chloride: 106 mmol/L (ref 98–111)
Creatinine, Ser: 0.59 mg/dL (ref 0.44–1.00)
GFR, Estimated: >60 mL/min (ref >60)
Glucose, Bld: 93 mg/dL (ref 70–99)
Potassium: 3.7 mmol/L (ref 3.5–5.1)
Sodium: 134 mmol/L — ABNORMAL LOW (ref 135–145)
Total Bilirubin: 0.4 mg/dL (ref 0.3–1.2)
Total Protein: 6.2 g/dL — ABNORMAL LOW (ref 6.5–8.1)

## 2020-01-30 LAB — CBC
HCT: 29.4 % — ABNORMAL LOW (ref 36.0–46.0)
Hemoglobin: 9 g/dL — ABNORMAL LOW (ref 12.0–15.0)
MCH: 28.8 pg (ref 26.0–34.0)
MCHC: 30.6 g/dL (ref 30.0–36.0)
MCV: 93.9 fL (ref 80.0–100.0)
Platelets: 189 10*3/uL (ref 150–400)
RBC: 3.13 MIL/uL — ABNORMAL LOW (ref 3.87–5.11)
RDW: 13 % (ref 11.5–15.5)
WBC: 7.2 10*3/uL (ref 4.0–10.5)
nRBC: 0 % (ref 0.0–0.2)

## 2020-01-30 LAB — HIV ANTIBODY (ROUTINE TESTING W REFLEX): HIV Screen 4th Generation wRfx: NONREACTIVE

## 2020-01-30 LAB — TYPE AND SCREEN
ABO/RH(D): B POS
Antibody Screen: NEGATIVE

## 2020-01-30 LAB — GLUCOSE TOLERANCE, 1 HOUR: Glucose, 1 Hour GTT: 91 mg/dL (ref 70–140)

## 2020-01-30 MED ORDER — MISOPROSTOL 50MCG HALF TABLET
50.0000 ug | ORAL_TABLET | ORAL | Status: DC | PRN
  Administered 2020-01-30 – 2020-01-31 (×5): 50 ug via BUCCAL
  Filled 2020-01-30 (×4): qty 1

## 2020-01-30 MED ORDER — MAGNESIUM SULFATE BOLUS VIA INFUSION
4.0000 g | Freq: Once | INTRAVENOUS | Status: AC
  Administered 2020-01-30: 4 g via INTRAVENOUS
  Filled 2020-01-30: qty 1000

## 2020-01-30 MED ORDER — BETAMETHASONE SOD PHOS & ACET 6 (3-3) MG/ML IJ SUSP
12.0000 mg | INTRAMUSCULAR | Status: AC
Start: 2020-01-30 — End: 2020-01-31
  Administered 2020-01-30 – 2020-01-31 (×2): 12 mg via INTRAMUSCULAR
  Filled 2020-01-30: qty 5

## 2020-01-30 MED ORDER — LABETALOL HCL 5 MG/ML IV SOLN
40.0000 mg | INTRAVENOUS | Status: DC | PRN
  Administered 2020-01-30 – 2020-02-01 (×2): 40 mg via INTRAVENOUS
  Filled 2020-01-30 (×2): qty 8

## 2020-01-30 MED ORDER — PENICILLIN G POT IN DEXTROSE 60000 UNIT/ML IV SOLN
3.0000 10*6.[IU] | INTRAVENOUS | Status: DC
  Administered 2020-01-30 – 2020-02-01 (×8): 3 10*6.[IU] via INTRAVENOUS
  Filled 2020-01-30 (×8): qty 50

## 2020-01-30 MED ORDER — OXYTOCIN BOLUS FROM INFUSION
333.0000 mL | Freq: Once | INTRAVENOUS | Status: AC
  Administered 2020-02-01: 333 mL via INTRAVENOUS

## 2020-01-30 MED ORDER — OXYTOCIN-SODIUM CHLORIDE 30-0.9 UT/500ML-% IV SOLN
2.5000 [IU]/h | INTRAVENOUS | Status: DC
  Administered 2020-02-01: 2.5 [IU]/h via INTRAVENOUS

## 2020-01-30 MED ORDER — ACETAMINOPHEN 325 MG PO TABS: 650.0000 mg | ORAL_TABLET | ORAL | Status: DC | PRN

## 2020-01-30 MED ORDER — TERBUTALINE SULFATE 1 MG/ML IJ SOLN
0.2500 mg | Freq: Once | INTRAMUSCULAR | Status: DC | PRN
Start: 2020-01-30 — End: 2020-01-31

## 2020-01-30 MED ORDER — MISOPROSTOL 50MCG HALF TABLET
50.0000 ug | ORAL_TABLET | ORAL | Status: DC | PRN
  Filled 2020-01-30: qty 1

## 2020-01-30 MED ORDER — LACTATED RINGERS IV SOLN: INTRAVENOUS | Status: DC

## 2020-01-30 MED ORDER — LIDOCAINE HCL (PF) 1 % IJ SOLN: 30.0000 mL | INTRAMUSCULAR | Status: DC | PRN

## 2020-01-30 MED ORDER — LABETALOL HCL 5 MG/ML IV SOLN
80.0000 mg | INTRAVENOUS | Status: DC | PRN
  Administered 2020-02-01: 80 mg via INTRAVENOUS
  Filled 2020-01-30: qty 16

## 2020-01-30 MED ORDER — MAGNESIUM SULFATE 40 GM/1000ML IV SOLN
2.0000 g/h | INTRAVENOUS | Status: DC
  Administered 2020-01-30 – 2020-02-01 (×3): 2 g/h via INTRAVENOUS
  Filled 2020-01-30 (×3): qty 1000

## 2020-01-30 MED ORDER — LABETALOL HCL 5 MG/ML IV SOLN
20.0000 mg | INTRAVENOUS | Status: DC | PRN
  Administered 2020-01-30 – 2020-02-01 (×4): 20 mg via INTRAVENOUS
  Filled 2020-01-30 (×4): qty 4

## 2020-01-30 MED ORDER — SODIUM CHLORIDE 0.9 % IV SOLN
5.0000 10*6.[IU] | Freq: Once | INTRAVENOUS | Status: AC
  Administered 2020-01-30: 5 10*6.[IU] via INTRAVENOUS
  Filled 2020-01-30: qty 5

## 2020-01-30 MED ORDER — LACTATED RINGERS IV SOLN
500.0000 mL | INTRAVENOUS | Status: DC | PRN
  Administered 2020-01-31 (×2): 500 mL via INTRAVENOUS
  Administered 2020-01-31: 250 mL via INTRAVENOUS

## 2020-01-30 MED ORDER — SOD CITRATE-CITRIC ACID 500-334 MG/5ML PO SOLN: 30.0000 mL | ORAL | Status: DC | PRN

## 2020-01-30 MED ORDER — HYDRALAZINE HCL 20 MG/ML IJ SOLN
10.0000 mg | INTRAMUSCULAR | Status: DC | PRN
  Administered 2020-02-01 (×2): 10 mg via INTRAVENOUS
  Filled 2020-01-30 (×2): qty 1

## 2020-01-30 MED ORDER — OXYCODONE-ACETAMINOPHEN 5-325 MG PO TABS: 2.0000 | ORAL_TABLET | ORAL | Status: DC | PRN

## 2020-01-30 MED ORDER — OXYCODONE-ACETAMINOPHEN 5-325 MG PO TABS: 1.0000 | ORAL_TABLET | ORAL | Status: DC | PRN

## 2020-01-30 MED ORDER — ONDANSETRON HCL 4 MG/2ML IJ SOLN
4.0000 mg | Freq: Four times a day (QID) | INTRAMUSCULAR | Status: DC | PRN
  Administered 2020-01-31: 4 mg via INTRAVENOUS
  Filled 2020-01-30 (×2): qty 2

## 2020-01-30 NOTE — MAU Provider Note (Addendum)
Chief Complaint:  Severe range blood pressure   First Provider Initiated Contact with Patient 01/30/20 1222     HPI: Evelyn Mcclain is a 25 y.o. [id number] at 37w0dby midtrimester ultrasound who [facility]. She endorses feelings of anxiousness and worry about a potential preterm delivery. She denies severe HA, vision change, abdominal pain. 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 Pt has been unable to take BP at home due to not being able to pick up the cuff at the facility she was directed to. She affirms smoking approx 5 cigarettes/day but denies alcohol or other drug usage.   Past Medical History: Past Medical History:  Diagnosis Date   Anemia    Chlamydia    Heart murmur    Hx of seasonal allergies    Infection    uti   Pregnancy induced hypertension    [place]   [place]    Past obstetric history: OB History  Gravida Para Term Preterm AB Living  4 1 0 1 2 1   SAB IAB Ectopic Multiple Live Births  0 2 0 0 1    # Outcome Date GA Lbr Len/2nd Weight Sex Delivery Anes PTL Lv  4 Current           3 IAB 2021          2 IAB 2020          1 Preterm 08/12/17 332w2d2:05 / 00:10 1740 g F Vag-Spont EPI  LIV    Past Surgical History: Past Surgical History:  Procedure Laterality Date   DILATION AND CURETTAGE OF UTERUS N/A 04/07/2019   Procedure: SUCTION DILATATION AND CURETTAGE;  Surgeon: PeDebbrah AlarMD;  [facility] Service: [facility]cology;  Laterality: N/A;   NO PAST SURGERIES      Family History: Family History  Problem Relation Age of Onset   Hypertension Mother    Hypertension Maternal Aunt    Hypertension Maternal Uncle    Hypertension Maternal Grandmother    Asthma Maternal Grandmother    Hypertension Maternal Grandfather    Diabetes Paternal  Grandmother    Diabetes Paternal Grandfather    Asthma Brother     Social History: Social History   Tobacco Use   Smoking status: Current Some Day Smoker    Packs/day: 0.00    Types: Cigarettes   Smokeless tobacco: Never Used  Substance Use Topics   Alcohol use: No   Drug use: Not Currently    Types: Marijuana    Comment: June 2019    Allergies:  Allergies  Allergen Reactions   Septra [Bactrim] Rash    Meds:  Medications Prior to Admission  Medication Sig Dispense Refill Last Dose   Blood Pressure Monitoring (BLOOD PRESSURE KIT) DEVI 1 Device by Does not apply route daily. (Patient not taking: Reported on 01/30/2020) 1 each 0    ferrous sulfate 325 (65 FE) MG tablet Take 1 tablet (325 mg total) by mouth daily with breakfast. (Patient not taking: Reported on 01/30/2020) 325 tablet 3    Magnesium Oxide (MAG-OXIDE) 200 MG TABS Take 2 tablets (400 mg total) by mouth at bedtime. If that amount causes loose stools in the am, switch to 20031maily at bedtime. (Patient not taking: Reported on 01/30/2020) 60 tablet 3    promethazine (PHENERGAN) 25  MG tablet Take 0.5-1 tablets (12.5-25 mg total) by mouth every 6 (six) hours as needed for nausea or vomiting. (Patient not taking: No sig reported) 30 tablet 0    ROS:  Constitutional: denies headache HEENT: denies change in vision  Respiratory: denies cough, SOB  GI: denies abdominal pain  Extremities: affirms ankle and pedal edema bilaterally  Psychological: affirms feeling anxious   I have reviewed patient's Past Medical Hx, Surgical Hx, Family Hx, Social Hx, medications and allergies.   Physical Exam   Patient Vitals for the past 24 hrs:  BP Temp Temp src Pulse Resp SpO2 Height Weight  01/30/20 1501 (!) 167/109 -- -- 87 -- -- -- --  01/30/20 1500 -- -- -- -- -- 100 % -- --  01/30/20 1455 -- -- -- -- -- 100 % -- --  01/30/20 1450 -- -- -- -- -- 100 % -- --  01/30/20 1446 (!) 171/105 -- -- 89 -- -- -- --  01/30/20 1445 -- -- -- --  -- 100 % -- --  01/30/20 1440 -- -- -- -- -- 100 % -- --  01/30/20 1416 (!) 156/91 -- -- 80 -- -- -- --  01/30/20 1415 -- -- -- -- -- 100 % -- --  01/30/20 1410 -- -- -- -- -- 100 % -- --  01/30/20 1405 -- -- -- -- -- 100 % -- --  01/30/20 1401 (!) 155/97 -- -- 80 -- -- -- --  01/30/20 1400 -- -- -- -- -- 100 % -- --  01/30/20 1355 -- -- -- -- -- 100 % -- --  01/30/20 1350 -- -- -- -- -- 100 % -- --  01/30/20 1347 (!) 153/92 -- -- 77 -- 100 % -- --  01/30/20 1340 -- -- -- -- -- 100 % -- --  01/30/20 1335 -- -- -- -- -- 100 % -- --  01/30/20 1332 (!) 155/85 -- -- 82 -- -- -- --  01/30/20 1330 -- -- -- -- -- 100 % -- --  01/30/20 1325 -- -- -- -- -- 100 % -- --  01/30/20 1320 -- -- -- -- -- 100 % -- --  01/30/20 1316 (!) 151/88 -- -- 79 -- -- -- --  01/30/20 1315 -- -- -- -- -- 100 % -- --  01/30/20 1310 -- -- -- -- -- 100 % -- --  01/30/20 1305 -- -- -- -- -- 100 % -- --  01/30/20 1301 (!) 158/94 -- -- 80 -- -- -- --  01/30/20 1300 -- -- -- -- -- 100 % -- --  01/30/20 1255 -- -- -- -- -- 100 % -- --  01/30/20 1250 -- -- -- -- -- 100 % -- --  01/30/20 1246 (!) 151/88 -- -- 82 -- -- -- --  01/30/20 1245 -- -- -- -- -- 100 % -- --  01/30/20 1240 -- -- -- -- -- 100 % -- --  01/30/20 1235 -- -- -- -- -- 100 % -- --  01/30/20 1231 (!) 149/94 -- -- 91 -- -- -- --  01/30/20 1230 -- -- -- -- -- 100 % -- --  01/30/20 1225 -- -- -- -- -- 100 % -- --  01/30/20 1201 (!) 166/103 -- -- 94 -- -- -- --  01/30/20 1157 (!) 174/119 -- -- (!) 106 -- -- -- --  01/30/20 1145 -- -- -- -- -- 100 % -- --  01/30/20 1141 (!) 181/103 -- -- 98 -- -- -- --  01/30/20 1140 -- -- -- -- -- 100 % -- --  01/30/20 1136 -- 97.7 F (36.5 C) Oral 96 18 100 % -- --  01/30/20 1129 -- -- -- -- -- -- 5' 7"  (1.702 m) 83.7 kg   Constitutional: Well-developed, well-nourished female in no acute distress.  Cardiovascular: normal rate Respiratory: normal effort, lung sounds clear to auscultation bilaterally in all fields   GI: Abd soft, non-tender to palpation, gravid appropriate for gestational age MS: Lower extremities nontender, mild 1+ pitting edema bilaterally in the ankles and feet, normal ROM Skin: warm and dry  Neurologic: Alert and oriented x 4.     FHT:  Baseline 150 , moderate variability, 10x10 accelerations present, no decelerations Rare contractions    Labs: Results for orders placed or performed during the hospital encounter of 01/30/20 (from the past 24 hour(s))  Glucose tolerance, 1 hour     Status: None   Collection Time: 01/30/20 11:45 AM  Result Value Ref Range   Glucose, 1 Hour GTT 91 70 - 140 mg/dL  CBC     Status: Abnormal   Collection Time: 01/30/20 11:45 AM  Result Value Ref Range   WBC 7.2 4.0 - 10.5 K/uL   RBC 3.13 (L) 3.87 - 5.11 MIL/uL   Hemoglobin 9.0 (L) 12.0 - 15.0 g/dL   HCT 29.4 (L) 36.0 - 46.0 %   MCV 93.9 80.0 - 100.0 fL   MCH 28.8 26.0 - 34.0 pg   MCHC 30.6 30.0 - 36.0 g/dL   RDW 13.0 11.5 - 15.5 %   Platelets 189 150 - 400 K/uL   nRBC 0.0 0.0 - 0.2 %  Comprehensive metabolic panel     Status: Abnormal   Collection Time: 01/30/20 11:45 AM  Result Value Ref Range   Sodium 134 (L) 135 - 145 mmol/L   Potassium 3.7 3.5 - 5.1 mmol/L   Chloride 106 98 - 111 mmol/L   CO2 20 (L) 22 - 32 mmol/L   Glucose, Bld 93 70 - 99 mg/dL   BUN 7 6 - 20 mg/dL   Creatinine, Ser 0.59 0.44 - 1.00 mg/dL   Calcium 8.3 (L) 8.9 - 10.3 mg/dL   Total Protein 6.2 (L) 6.5 - 8.1 g/dL   Albumin 2.6 (L) 3.5 - 5.0 g/dL   AST 17 15 - 41 U/L   ALT 11 0 - 44 U/L   Alkaline Phosphatase 119 38 - 126 U/L   Total Bilirubin 0.4 0.3 - 1.2 mg/dL   GFR, Estimated >60 >60 mL/min   Anion gap 8 5 - 15  HIV Antibody (routine testing w rflx)     Status: None   Collection Time: 01/30/20 11:45 AM  Result Value Ref Range   HIV Screen 4th Generation wRfx Non Reactive Non Reactive  Type and screen     Status: None   Collection Time: 01/30/20 12:07 PM  Result Value Ref Range   ABO/RH(D) B POS     Antibody Screen NEG    Sample Expiration      02/02/2020,2359 Performed at [facility], [address], [place], [place] [address]   Protein / creatinine ratio, urine     Status: None   Collection Time: 01/30/20 12:46 PM  Result Value Ref Range   Creatinine, Urine 230.44 mg/dL   Total Protein, Urine 17 mg/dL   Protein Creatinine Ratio 0.07 0.00 - 0.15 mg/mg[Cre]   B POS (01/05 1207)  MAU Course/MDM: Orders Placed This Encounter  Procedures  Culture, beta strep (group b only)   Glucose tolerance, 1 hour   CBC   Comprehensive metabolic panel   Protein / creatinine ratio, urine   RPR   HIV Antibody (routine testing w rflx)   Notify Physician   Measure blood pressure   Type and screen   Insert and maintain IV Line   Admit to Inpatient (patient's expected length of stay will be greater than 2 midnights or inpatient only procedure)    Meds ordered this encounter  Medications   AND Linked Order Group    labetalol (NORMODYNE) injection 20 mg    labetalol (NORMODYNE) injection 40 mg    labetalol (NORMODYNE) injection 80 mg    hydrALAZINE (APRESOLINE) injection 10 mg   betamethasone acetate-betamethasone sodium phosphate (CELESTONE) injection 12 mg   lactated ringers infusion    Assessment: 1. Preeclampsia, severe, third trimester   Preeclampsia supported by severe range BPs without other pertinent lab findings  2. Anemia  Hgb 9.0 and HCT 29.4   Plan: Admit for induction of labor. Bedside ultrasound revealed vertex presentation.    Cecilie Lowers, Student PA 01/30/2020 3:23 PM  CNM attestation:  I have seen and examined this patient; I agree with above documentation in the PA student's note.   Evelyn Mcclain is a 25 y.o. [phone] sent from the office for severe range BPs. +FM, denies LOF, VB, contractions, vaginal discharge.  PE: BP (!) 148/83   Pulse 78   Temp 98.5 F (36.9 C) (Axillary)   Resp 18   Ht 5' 7"  (1.702 m)   Wt 184 lb 9.6 oz (83.7 kg)   LMP  04/08/2019 (Exact Date) Comment: had incomplete AB, went to OR, no period since, no F/U  SpO2 100%   BMI 28.91 kg/m    Gen: calm comfortable, NAD Resp: normal effort, no distress Abd: gravid  ROS, labs, PMH reviewed NST reactive   Plan: Consult Dr Elgie Congo and Dr Roselie Awkward with normal PEC labs but severe range BPs.  Admit for delivery for PEC with severe features based on blood pressures. Discussed admission with pt who is tearful but states understanding Mag sulfate, BMZ, UDS, GBS, wet prep, GCC ordered on admission  Fatima Blank, CNM 7:30 PM

## 2020-01-30 NOTE — H&P (Addendum)
OBSTETRIC ADMISSION HISTORY AND PHYSICAL  Evelyn Mcclain is a 25 y.o. female [phone] with IUP at 35w0dby 20 wk u/s presenting for elevated BP. Patient with severe range BP in clinic sent to MAU for evaluation, and admitted for IOL for preE w/ SF. She reports +FMs, No LOF, no VB, no blurry vision, headaches or peripheral edema, and RUQ pain.  She plans on bottle feeding. She requests depo for birth control. [facility]: By 20 wk u/s --->  Estimated Date of Delivery: 03/05/20  Sono:    11/21/19@[redacted]w[redacted]d , CWD, normal anatomy, breech presentation, 747g, 35% EFW   Prenatal History/Complications:  Late/intermittent PNC History of PTD History of preE/postpartum HELLP H/o Polysubstance abuse Bereavement- FOB passed away 3 months ago  Past Medical History: Past Medical History:  Diagnosis Date  . Anemia   . Chlamydia   . Heart murmur   . Hx of seasonal allergies   . Infection    uti  . Pregnancy induced hypertension   . [place]ach[place]oid hemorrhage following [place]  . Subdural hematoma, [place]-traumatic ([facility] 08/[place]    Past Surgical History: Past Surgical History:  Procedure Laterality Date  . DILATION AND CURETTAGE OF UTERUS N/A 04/07/2019   Procedure: SUCTION DILATATION AND CURETTAGE;  Surgeon: PDebbrah Alar MD;  [facility]  Service: Gynecology;  Laterality: N/A;  . NO PAST SURGERIES      Obstetrical History: OB History    Gravida  4   Para  1   Term  0   Preterm  1   AB  2   Living  1     SAB  0   IAB  2   Ectopic  0   Multiple  0   Live Births  1           Social History Social History   Socioeconomic History  . Marital status: Single    Spouse name: Not on file  . Number of children: Not on file  . Years of education: Not on file  . Highest education level: Not on file  Occupational History  . Not on file  Tobacco Use  . Smoking status: Current Some Day Smoker    Packs/day: 0.25     Types: Cigarettes  . Smokeless tobacco: Never Used  Substance and Sexual Activity  . Alcohol use: No  . Drug use: Not Currently    Types: Marijuana    Comment: June 2019  . Sexual activity: Yes    Birth control/protection: None  Other Topics Concern  . Not on file  Social History Narrative  . Not on file   Social Determinants of Health   Financial Resource Strain: Not on file  Food Insecurity: Not on file  Transportation Needs: Not on file  Physical Activity: Not on file  Stress: Not on file  Social Connections: Not on file    Family History: Family History  Problem Relation Age of Onset  . Hypertension Mother   . Hypertension Maternal Aunt   . Hypertension Maternal Uncle   . Hypertension Maternal Grandmother   . Asthma Maternal Grandmother   . Hypertension Maternal Grandfather   . Diabetes Paternal Grandmother   . Diabetes Paternal Grandfather   . Asthma Brother     Allergies: Allergies  Allergen Reactions  . Septra [Bactrim] Rash    Medications Prior to Admission  Medication Sig Dispense Refill Last Dose  . Blood Pressure Monitoring (BLOOD PRESSURE KIT) DEVI  1 Device by Does not apply route daily. (Patient not taking: Reported on 01/30/2020) 1 each 0   . ferrous sulfate 325 (65 FE) MG tablet Take 1 tablet (325 mg total) by mouth daily with breakfast. (Patient not taking: Reported on 01/30/2020) 325 tablet 3   . Magnesium Oxide (MAG-OXIDE) 200 MG TABS Take 2 tablets (400 mg total) by mouth at bedtime. If that amount causes loose stools in the am, switch to 274m daily at bedtime. (Patient not taking: Reported on 01/30/2020) 60 tablet 3   . promethazine (PHENERGAN) 25 MG tablet Take 0.5-1 tablets (12.5-25 mg total) by mouth every 6 (six) hours as needed for nausea or vomiting. (Patient not taking: No sig reported) 30 tablet 0      Review of Systems   All systems reviewed and negative except as stated in HPI  Blood pressure (!) 157/92, pulse 76, temperature 98.5 F  (36.9 C), temperature source Axillary, resp. rate 18, height 5' 7"  (1.702 m), weight 83.7 kg, last menstrual period 04/08/2019, SpO2 100 %, unknown if currently breastfeeding. General appearance: alert, cooperative and no distress Lungs: normal respiratory effort Heart: regular rate and rhythm Abdomen: soft, non-tender; gravid Pelvic: as noted below Extremities: Homans sign is negative, no sign of DVT Presentation: cephalic by BSUS in MAU Fetal monitoringBaseline: 150 bpm, Variability: Good {> 6 bpm), Accelerations: Reactive and Decelerations: Absent Uterine activity irritable Dilation: Fingertip Effacement (%): Thick Station: -3 Exam by:: Dr. MChauncey Reading  Prenatal labs: ABO, Rh: --/--/B POS (01/05 1207) Antibody: NEG (01/05 1207) Rubella: 5.65 (09/14 1015) RPR: Non Reactive (09/14 1015)  HBsAg: Negative (09/14 1015)  HIV: Non Reactive (01/05 1145)  GBS:   pending, given GA 1 hr Glucola passed Genetic screening not done Anatomy UKoreanormal  Prenatal Transfer Tool  Maternal Diabetes: No Genetic Screening: Declined Maternal Ultrasounds/Referrals: Normal Fetal Ultrasounds or other Referrals:  None Maternal Substance Abuse:  No- h/o SUD, patient denies, UDS pending Significant Maternal Medications:  None Significant Maternal Lab Results: Other: GBS unk  Results for orders placed or performed during the hospital encounter of 01/30/20 (from the past 24 hour(s))  Glucose tolerance, 1 hour   Collection Time: 01/30/20 11:45 AM  Result Value Ref Range   Glucose, 1 Hour GTT 91 70 - 140 mg/dL  CBC   Collection Time: 01/30/20 11:45 AM  Result Value Ref Range   WBC 7.2 4.0 - 10.5 K/uL   RBC 3.13 (L) 3.87 - 5.11 MIL/uL   Hemoglobin 9.0 (L) 12.0 - 15.0 g/dL   HCT 29.4 (L) 36.0 - 46.0 %   MCV 93.9 80.0 - 100.0 fL   MCH 28.8 26.0 - 34.0 pg   MCHC 30.6 30.0 - 36.0 g/dL   RDW 13.0 11.5 - 15.5 %   Platelets 189 150 - 400 K/uL   nRBC 0.0 0.0 - 0.2 %  Comprehensive metabolic panel    Collection Time: 01/30/20 11:45 AM  Result Value Ref Range   Sodium 134 (L) 135 - 145 mmol/L   Potassium 3.7 3.5 - 5.1 mmol/L   Chloride 106 98 - 111 mmol/L   CO2 20 (L) 22 - 32 mmol/L   Glucose, Bld 93 70 - 99 mg/dL   BUN 7 6 - 20 mg/dL   Creatinine, Ser 0.59 0.44 - 1.00 mg/dL   Calcium 8.3 (L) 8.9 - 10.3 mg/dL   Total Protein 6.2 (L) 6.5 - 8.1 g/dL   Albumin 2.6 (L) 3.5 - 5.0 g/dL   AST 17 15 -  41 U/L   ALT 11 0 - 44 U/L   Alkaline Phosphatase 119 38 - 126 U/L   Total Bilirubin 0.4 0.3 - 1.2 mg/dL   GFR, Estimated >60 >60 mL/min   Anion gap 8 5 - 15  HIV Antibody (routine testing w rflx)   Collection Time: 01/30/20 11:45 AM  Result Value Ref Range   HIV Screen 4th Generation wRfx Non Reactive Non Reactive  Type and screen   Collection Time: 01/30/20 12:07 PM  Result Value Ref Range   ABO/RH(D) B POS    Antibody Screen NEG    Sample Expiration      02/02/2020,2359 Performed at [facility], [place] [address], [place], [place] [address]   Protein / creatinine ratio, urine   Collection Time: 01/30/20 12:46 PM  Result Value Ref Range   Creatinine, Urine 230.44 mg/dL   Total Protein, Urine 17 mg/dL   Protein Creatinine Ratio 0.07 0.00 - 0.15 mg/mg[Cre]  Resp Panel by RT-PCR (Flu A&B, Covid) Vaginal/Rectal   Collection Time: 01/30/20  3:41 PM   Specimen: Vaginal/Rectal; Nasopharyngeal(NP) swabs in vial transport medium  Result Value Ref Range   SARS Coronavirus 2 by RT PCR NEGATIVE NEGATIVE   Influenza A by PCR NEGATIVE NEGATIVE   Influenza B by PCR NEGATIVE NEGATIVE    Patient Active Problem List   Diagnosis Date Noted  . Anemia affecting pregnancy in third trimester 01/30/2020  . Supervision of high risk pregnancy, antepartum 10/09/2019  . History of HELLP syndrome, currently pregnant 08/15/2017  . History of gestational hypertension 08/08/2017  . UTI (urinary tract infection) during pregnancy, second trimester 05/12/2017  . Marijuana use 03/15/2017  .  Polysubstance abuse ([place]) 08/30/2011    Assessment/Plan:  TIFANY HIRSCH is a 25 y.o. [phone] at 40w0dhere for IOL-preE with SF.  #IOL: Patient seen in clinic today with severe range BP and had repeat severe range BP on presentation to MAU so decision made for IOL. Given cervical exam will dose cytotec and place FB when able. #preE w/ SF: Based on severe range BP. Asymptomatic. PreE labs nml. Has required labetalol while in MAU. Mg started in MAU. History of preE with prior delivery and also had postpartum HELPP at that time. Continue to monitor. #Pain: PRN #FWB:  cat 1 #ID: GBS pending, PCN ordered given preterm #MOF: Bottle #MOC: Depo #Circ: yes #polysubstance abuse: patient denies drug use this pregnancy. UDS pending. #Grief: FOB passed away 3 months ago. FOB's mother is coming to support patient, but she is tearful as she is remembering that she had his support at her last birth. Will consult SW for support and resources.   CGladys Damme MD CCusickResidency, PGY-2  GME ATTESTATION:  I saw and evaluated the patient. I agree with the findings and the plan of care as documented in the resident's note.  AArrie Senate MD OB Fellow, FWilliamsfor WT[place]1/05/2020 6:17 PM

## 2020-01-30 NOTE — Progress Notes (Signed)
Patient ID: Evelyn Mcclain, female   DOB: 1995-08-15, 25 y.o.   MRN: [id number]  Mostly comfortable s/p cytotec x 1 dose at 1813; denies s/s pre-e; s/p two doses of IV Lab in MAU but BP is more stable on L&D; s/p PCN x 1 dose and BMZ x 1; mag sulfate infusing  BPs 148/83, 156/85 FHR 130-140s, +accels, no decels Ctx irreg, mild Cx was FT/thick   IUP@35 .0 Pre-e w SF (^BPs) Cx unfavorable GBS unk (pending)  Plan to repeat cytotec dosing in 4 hrs as well as cervical foley placement at some time tonight, followed by Pit/AROM prn  Arabella Merles CNM 01/30/2020 7:54 PM

## 2020-01-30 NOTE — Progress Notes (Signed)
   PRENATAL VISIT NOTE  Subjective:  Evelyn Mcclain is a 25 y.o. [phone] at [redacted]w[redacted]d being seen today for ongoing prenatal care.  She is currently monitored for the following issues for this high-risk pregnancy and has Polysubstance abuse (HCC); Supervision of normal first pregnancy, antepartum; Marijuana use; UTI (urinary tract infection) during pregnancy, second trimester; Preeclampsia; HELLP syndrome, delivered, current hospitalization; Incomplete abortion; and Supervision of high risk pregnancy, antepartum on their problem list.  Patient reports no complaints.  Contractions: Not present. Vag. Bleeding: None.  Movement: Present. Denies leaking of fluid.   The following portions of the patient's history were reviewed and updated as appropriate: allergies, current medications, past family history, past medical history, past social history, past surgical history and problem list.   Objective:   Vitals:   01/30/20 1044 01/30/20 1108  BP: (!) 181/110 (!) 190/103  Pulse: 86 91  Weight: 183 lb 1.6 oz (83.1 kg)     Fetal Status:     Movement: Present     General:  Alert, oriented and cooperative. Patient is in no acute distress.  Skin: Skin is warm and dry. No rash noted.   Cardiovascular: Normal heart rate noted  Respiratory: Normal respiratory effort, no problems with respiration noted  Abdomen: Soft, gravid, appropriate for gestational age.  Pain/Pressure: Absent     Pelvic: Cervical exam deferred        Extremities: Normal range of motion.  Edema: Deep pitting, indentation remains for a short time  Mental Status: Normal mood and affect. Normal behavior. Normal judgment and thought content.   Assessment and Plan:  Pregnancy: [id number] at [redacted]w[redacted]d 1. Supervision of normal first pregnancy, antepartum - Labs put in, pt drank glucola drink, labs unable to be drawn due to severe range blood pressure readings  2. [redacted] weeks gestation of pregnancy - Pt very upset over murder of FOB three months ago,  became very tearful and anxious upon blood pressure reading. Encouragement provided, pt able to stop crying and verbalize understanding of instructions.  3. Elevated blood pressure affecting pregnancy in third trimester, antepartum - First BP= 181/110, second was later after pt had calmed down = 190/103 - Pt instructed to go straight to MAU for evaluation and management, report called to Sharen Counter, CNM  Future Appointments  Date Time Provider Department Center  02/06/2020  1:30 PM Noralee Chars [facility]  02/07/2020  1:15 PM [facility] NURSE [facility]  02/07/2020  1:30 PM [facility] US3 [facility]    Bernerd Limbo, CNM

## 2020-01-31 ENCOUNTER — Encounter (HOSPITAL_COMMUNITY): Payer: Self-pay | Admitting: Obstetrics & Gynecology

## 2020-01-31 ENCOUNTER — Other Ambulatory Visit: Payer: Self-pay

## 2020-01-31 ENCOUNTER — Inpatient Hospital Stay (HOSPITAL_COMMUNITY): Payer: Medicaid Other | Admitting: Anesthesiology

## 2020-01-31 LAB — CBC
HCT: 27.6 % — ABNORMAL LOW (ref 36.0–46.0)
Hemoglobin: 8.5 g/dL — ABNORMAL LOW (ref 12.0–15.0)
MCH: 28.3 pg (ref 26.0–34.0)
MCHC: 30.8 g/dL (ref 30.0–36.0)
MCV: 92 fL (ref 80.0–100.0)
Platelets: 196 10*3/uL (ref 150–400)
RBC: 3 MIL/uL — ABNORMAL LOW (ref 3.87–5.11)
RDW: 13.2 % (ref 11.5–15.5)
WBC: 9.9 10*3/uL (ref 4.0–10.5)
nRBC: 0 % (ref 0.0–0.2)

## 2020-01-31 LAB — RPR: RPR Ser Ql: NONREACTIVE

## 2020-01-31 LAB — GC/CHLAMYDIA PROBE AMP (~~LOC~~) NOT AT ARMC
Chlamydia: NEGATIVE
Comment: NEGATIVE
Comment: NORMAL
Neisseria Gonorrhea: NEGATIVE

## 2020-01-31 MED ORDER — PHENYLEPHRINE 40 MCG/ML (10ML) SYRINGE FOR IV PUSH (FOR BLOOD PRESSURE SUPPORT)
80.0000 ug | PREFILLED_SYRINGE | INTRAVENOUS | Status: DC | PRN
  Filled 2020-01-31: qty 10

## 2020-01-31 MED ORDER — DIPHENHYDRAMINE HCL 50 MG/ML IJ SOLN: 12.5000 mg | INTRAMUSCULAR | Status: DC | PRN

## 2020-01-31 MED ORDER — EPHEDRINE 5 MG/ML INJ
10.0000 mg | INTRAVENOUS | Status: AC | PRN
  Administered 2020-01-31 (×2): 10 mg via INTRAVENOUS
  Filled 2020-01-31: qty 10

## 2020-01-31 MED ORDER — SODIUM CHLORIDE 0.9 % IV SOLN
500.0000 mg | Freq: Once | INTRAVENOUS | Status: AC
  Administered 2020-01-31: 500 mg via INTRAVENOUS
  Filled 2020-01-31: qty 25

## 2020-01-31 MED ORDER — SODIUM CHLORIDE (PF) 0.9 % IJ SOLN
INTRAMUSCULAR | Status: DC | PRN
  Administered 2020-01-31: 12 mL/h via EPIDURAL

## 2020-01-31 MED ORDER — FENTANYL CITRATE (PF) 100 MCG/2ML IJ SOLN: 100.0000 ug | INTRAMUSCULAR | Status: DC | PRN

## 2020-01-31 MED ORDER — MISOPROSTOL 25 MCG QUARTER TABLET
ORAL_TABLET | ORAL | Status: AC
  Administered 2020-01-31: 25 ug via VAGINAL
  Filled 2020-01-31: qty 1

## 2020-01-31 MED ORDER — EPHEDRINE 5 MG/ML INJ: 10.0000 mg | INTRAVENOUS | Status: DC | PRN

## 2020-01-31 MED ORDER — BUTORPHANOL TARTRATE 1 MG/ML IJ SOLN
1.0000 mg | INTRAMUSCULAR | Status: DC | PRN
Start: 2020-01-31 — End: 2020-02-01
  Administered 2020-01-31: 1 mg via INTRAVENOUS
  Filled 2020-01-31: qty 1

## 2020-01-31 MED ORDER — TERBUTALINE SULFATE 1 MG/ML IJ SOLN: 0.2500 mg | Freq: Once | INTRAMUSCULAR | Status: DC | PRN

## 2020-01-31 MED ORDER — LIDOCAINE HCL (PF) 1 % IJ SOLN
INTRAMUSCULAR | Status: DC | PRN
  Administered 2020-01-31: 10 mL via EPIDURAL

## 2020-01-31 MED ORDER — MISOPROSTOL 25 MCG QUARTER TABLET: 25.0000 ug | ORAL_TABLET | ORAL | Status: DC | PRN

## 2020-01-31 MED ORDER — FENTANYL-BUPIVACAINE-NACL 0.5-0.125-0.9 MG/250ML-% EP SOLN
12.0000 mL/h | EPIDURAL | Status: DC | PRN
Start: 2020-01-31 — End: 2020-02-01
  Administered 2020-02-01: 12 mL/h via EPIDURAL
  Filled 2020-01-31 (×2): qty 250

## 2020-01-31 MED ORDER — PROMETHAZINE HCL 25 MG/ML IJ SOLN
12.5000 mg | Freq: Four times a day (QID) | INTRAMUSCULAR | Status: DC | PRN
  Administered 2020-01-31: 12.5 mg via INTRAVENOUS
  Filled 2020-01-31: qty 1

## 2020-01-31 MED ORDER — PHENYLEPHRINE 40 MCG/ML (10ML) SYRINGE FOR IV PUSH (FOR BLOOD PRESSURE SUPPORT)
80.0000 ug | PREFILLED_SYRINGE | INTRAVENOUS | Status: DC | PRN
  Administered 2020-01-31 (×2): 80 ug via INTRAVENOUS

## 2020-01-31 MED ORDER — LACTATED RINGERS IV SOLN: 500.0000 mL | Freq: Once | INTRAVENOUS | Status: DC

## 2020-01-31 NOTE — Progress Notes (Signed)
Labor Progress Note Evelyn Mcclain is a 25 y.o. [phone] at [redacted]w[redacted]d presented for IOL for severe PEC  S:  Comfortable. Not feeling ctx. Denies HA, visual disturbances, RUQ pain, SOB, and CP.  O:  BP (!) 158/82   Pulse 84   Temp 98.3 F (36.8 C) (Oral)   Resp 18   Ht 5\' 7"  (1.702 m)   Wt 83.7 kg   LMP 04/08/2019 (Exact Date) Comment: had incomplete AB, went to OR, no period since, no F/U  SpO2 100%   BMI 28.91 kg/m  EFM: baseline 120 bpm/ mod variability/ + accels/ no decels  Toco/IUPC: 2-5 SVE: deferred  A/P: 25 y.o. 22 [redacted]w[redacted]d  1. Labor: latent 2. FWB: Cat I 3. Pain: analgesia/anesthesia prn 4. Severe PEC; MgSO4 infusing, asymptomatic, stable BP  Continue Cytotec. Consider foley placement at next check, offered to pre-medicate since didn't tolerate last check, pt agrees with plan. Anticipate labor progress and SVD.  [redacted]w[redacted]d, CNM 9:56 AM

## 2020-01-31 NOTE — Anesthesia Preprocedure Evaluation (Signed)
Anesthesia Evaluation  Patient identified by MRN, date of birth, ID band Patient awake    Reviewed: Allergy & Precautions, H&P , NPO status , Patient's Chart, lab work & pertinent test results  Airway Mallampati: I  TM Distance: >3 FB Neck ROM: full    Dental no notable dental hx.    Pulmonary neg pulmonary ROS, Current Smoker,    Pulmonary exam normal breath sounds clear to auscultation       Cardiovascular hypertension, Pt. on medications negative cardio ROS Normal cardiovascular exam Rhythm:regular Rate:Normal     Neuro/Psych negative neurological ROS  negative psych ROS   GI/Hepatic negative GI ROS, Neg liver ROS,   Endo/Other  negative endocrine ROS  Renal/GU negative Renal ROS  negative genitourinary   Musculoskeletal negative musculoskeletal ROS (+)   Abdominal Normal abdominal exam  (+)   Peds  Hematology negative hematology ROS (+)   Anesthesia Other Findings   Reproductive/Obstetrics (+) Pregnancy                             Anesthesia Physical Anesthesia Plan  ASA: II  Anesthesia Plan: Epidural   Post-op Pain Management:    Induction:   PONV Risk Score and Plan:   Airway Management Planned:   Additional Equipment:   Intra-op Plan:   Post-operative Plan:   Informed Consent: I have reviewed the patients History and Physical, chart, labs and discussed the procedure including the risks, benefits and alternatives for the proposed anesthesia with the patient or authorized representative who has indicated his/her understanding and acceptance.       Plan Discussed with:   Anesthesia Plan Comments:         Anesthesia Quick Evaluation

## 2020-01-31 NOTE — Progress Notes (Signed)
Labor Progress Note Evelyn Mcclain is a 25 y.o. [phone] at [redacted]w[redacted]d presented for IOL 1/5-preE w/ SF (BP).  S: Doing well without complaints. No headaches, vision changes, cp, sob.  O:  BP (!) 151/90   Pulse 89   Temp 98.3 F (36.8 C) (Oral)   Resp 17   Ht 5\' 7"  (1.702 m)   Wt 83.7 kg   LMP 04/08/2019 (Exact Date) Comment: had incomplete AB, went to OR, no period since, no F/U  SpO2 97%   BMI 28.91 kg/m  EFM: baseline 120 bpm/mod variability/+ accels/no decels Toco: q1-7 minutes  CVE: Dilation: 2 Effacement (%): 60 Station: -3 Presentation: Vertex (confirmed by ultrasound) Exam by:: [email or web address], CNM   A&P: 25 y.o. 22 [redacted]w[redacted]d presented for IOL 1/5-preE w/ SF (BP). #IOL: Started IOL 1/5 after patient found to have severe range BP in office and then again upon presentation to MAU. S/p cyto x5. FB in place, not dislodged with this check. Will re-dose cytotec and start pitocin in 4 hours. #Pain: epidural #FWB: cat 1 #GBS pending, PCN, adequate prophylaxis  #preE w/ SF: based on multiple severe range BP. On Mg and anti-htn protocol. No signs of Mg toxicity. Asymptomatic. Repeat preE labs pending as well as Mg level. Continue to monitor.  #history of polysubstance abuse: UDS on admit +THC #Grief: FOB murdered a few months ago, SW postpartum  [redacted]w[redacted]d, MD 10:22 PM

## 2020-01-31 NOTE — Anesthesia Procedure Notes (Signed)
Epidural Patient location during procedure: OB Start time: 01/31/2020 1:54 PM End time: 01/31/2020 2:01 PM  Staffing Anesthesiologist: Leilani Able, MD Performed: anesthesiologist   Preanesthetic Checklist Completed: patient identified, IV checked, site marked, risks and benefits discussed, surgical consent, monitors and equipment checked, pre-op evaluation and timeout performed  Epidural Patient position: sitting Prep: DuraPrep and site prepped and draped Patient monitoring: continuous pulse ox and blood pressure Approach: midline Location: L3-L4 Injection technique: LOR air  Needle:  Needle type: Tuohy  Needle gauge: 17 G Needle length: 9 cm and 9 Needle insertion depth: 7 cm Catheter type: closed end flexible Catheter size: 19 Gauge Catheter at skin depth: 12 cm Test dose: negative and Other  Assessment Events: blood not aspirated, injection not painful, no injection resistance, no paresthesia and negative IV test  Additional Notes Reason for block:procedure for pain

## 2020-01-31 NOTE — Progress Notes (Signed)
Labor Progress Note Evelyn Mcclain is a 25 y.o. [phone] at [redacted]w[redacted]d presented for IOL for severe PEC  S:  Comfortable, recently pre-medicated for foley insertion.  O:  BP (!) 154/81   Pulse 79   Temp 98.3 F (36.8 C) (Oral)   Resp 16   Ht 5\' 7"  (1.702 m)   Wt 83.7 kg   LMP 04/08/2019 (Exact Date) Comment: had incomplete AB, went to OR, no period since, no F/U  SpO2 100%   BMI 28.91 kg/m  EFM: baseline 130 bpm/ mod variability/ no accels/ no decels  Toco/IUPC: q2-5 SVE: 1/60/-2  A/P: 25 y.o. 22 [redacted]w[redacted]d  1. Labor: latent 2. FWB: Cat I 3. Pain: analgesia prn 4. Severe PEC: required 1 dose IV Labetalol  After consent, foley attempted but pt unable to tolerate placement. Offered early epidural to assist placement, pt agrees. Will obtain CBC then epidural, and attempt later. Anticipate labor progress and SVD.  [redacted]w[redacted]d, CNM 1:24 PM

## 2020-01-31 NOTE — Progress Notes (Signed)
Labor Progress Note Evelyn Mcclain is a 25 y.o. [phone] at [redacted]w[redacted]d presented for IOL for severe PEC.  S:  Comfortable with epidural. Ready for FB attempt.  O:  BP 128/62   Pulse 80   Temp 98.9 F (37.2 C) (Oral)   Resp 16   Ht 5\' 7"  (1.702 m)   Wt 83.7 kg   LMP 04/08/2019 (Exact Date) Comment: had incomplete AB, went to OR, no period since, no F/U  SpO2 98%   BMI 28.91 kg/m  EFM: baseline 130 bpm/ mod variability/ + accels/ no decels  Toco/IUPC: 6-7 SVE: Dilation: 2 Effacement (%): 60 Station: -3 Presentation: Vertex Exam by:: [email or web address], CNM   A/P: 25 y.o. 22 [redacted]w[redacted]d  1. Labor: latent 2. FWB: Cat I 3. Pain: epidural 4. PEC: stable  Consented for FB placement, inserted w/o difficulty, tolerated well. Continue Cytotec. Anticipate labor progress and SVD.  [redacted]w[redacted]d, CNM 5:51 PM

## 2020-01-31 NOTE — Progress Notes (Signed)
Labor Progress Note Evelyn Mcclain is a 24 y.o. [phone] at [redacted]w[redacted]d presented for IOL for PEC  S:  Comfortable with epidural.  O:  BP (!) 125/55   Pulse 85   Temp 98.8 F (37.1 C) (Oral)   Resp 18   Ht 5\' 7"  (1.702 m)   Wt 83.7 kg   LMP 04/08/2019 (Exact Date) Comment: had incomplete AB, went to OR, no period since, no F/U  SpO2 99%   BMI 28.91 kg/m  EFM: baseline 130 bpm/ mod variability/ no accels/ late decels  Toco/IUPC: q6 SVE: deferred  A/P: 25 y.o. 22 [redacted]w[redacted]d  1. Labor: latent 2. FWB: Cat II 3. Pain: epidural 4. PEC: stable  FHR with late decels after epidural, pt repositioned. Hypotension noted, anesthesia at bedside dosing. Will attempt FB once BP and FHR are stable. Anticipate labor progress and SVD.  [redacted]w[redacted]d, CNM 3:08 PM

## 2020-01-31 NOTE — Progress Notes (Signed)
Patient ID: Lendell Caprice, female   DOB: Mar 23, 1995, 25 y.o.   MRN: [id number]  S/P buccal cytotec x 3 doses; not feeling much in the way of cramping; open to cervical foley placement; mag sulfate infusing; denies s/s pre-e; she is expressing sadness over FOB not being present  BP 129/73, P 91 FHR 115-120, +accels, no decels Ctx mild, irreg Cx FT/60/vtx -2, difficult exam due to pt's discomfort  IUP@35 .1wks Pre-e w SF- stable BPs for now Cx unfavorable  -Vag cytotec dose placed; discussed exam again in 4hrs with pre-medication, esp if foley attempt -Second BMZ ~ 1500 if still pregnant -Support given  Arabella Merles CNM 01/31/2020

## 2020-02-01 ENCOUNTER — Encounter (HOSPITAL_COMMUNITY): Payer: Self-pay | Admitting: Obstetrics & Gynecology

## 2020-02-01 DIAGNOSIS — Z8751 Personal history of pre-term labor: Secondary | ICD-10-CM | POA: Insufficient documentation

## 2020-02-01 DIAGNOSIS — O99320 Drug use complicating pregnancy, unspecified trimester: Secondary | ICD-10-CM | POA: Insufficient documentation

## 2020-02-01 DIAGNOSIS — O1413 Severe pre-eclampsia, third trimester: Secondary | ICD-10-CM | POA: Diagnosis present

## 2020-02-01 DIAGNOSIS — O1414 Severe pre-eclampsia complicating childbirth: Secondary | ICD-10-CM

## 2020-02-01 DIAGNOSIS — F1910 Other psychoactive substance abuse, uncomplicated: Secondary | ICD-10-CM | POA: Insufficient documentation

## 2020-02-01 LAB — GLUCOSE, CAPILLARY
Glucose-Capillary: 131 mg/dL — ABNORMAL HIGH (ref 70–99)
Glucose-Capillary: 133 mg/dL — ABNORMAL HIGH (ref 70–99)

## 2020-02-01 LAB — COMPREHENSIVE METABOLIC PANEL
ALT: 13 U/L (ref 0–44)
AST: 21 U/L (ref 15–41)
Albumin: 2.3 g/dL — ABNORMAL LOW (ref 3.5–5.0)
Alkaline Phosphatase: 112 U/L (ref 38–126)
Anion gap: 10 (ref 5–15)
BUN: 7 mg/dL (ref 6–20)
CO2: 19 mmol/L — ABNORMAL LOW (ref 22–32)
Calcium: 7.5 mg/dL — ABNORMAL LOW (ref 8.9–10.3)
Chloride: 105 mmol/L (ref 98–111)
Creatinine, Ser: 0.82 mg/dL (ref 0.44–1.00)
GFR, Estimated: >60 mL/min (ref >60)
Glucose, Bld: 150 mg/dL — ABNORMAL HIGH (ref 70–99)
Potassium: 4.4 mmol/L (ref 3.5–5.1)
Sodium: 134 mmol/L — ABNORMAL LOW (ref 135–145)
Total Bilirubin: 0.4 mg/dL (ref 0.3–1.2)
Total Protein: 6.3 g/dL — ABNORMAL LOW (ref 6.5–8.1)

## 2020-02-01 LAB — CBC
HCT: 26.8 % — ABNORMAL LOW (ref 36.0–46.0)
HCT: 28.9 % — ABNORMAL LOW (ref 36.0–46.0)
Hemoglobin: 8.4 g/dL — ABNORMAL LOW (ref 12.0–15.0)
Hemoglobin: 8.9 g/dL — ABNORMAL LOW (ref 12.0–15.0)
MCH: 28.9 pg (ref 26.0–34.0)
MCH: 29.1 pg (ref 26.0–34.0)
MCHC: 30.8 g/dL (ref 30.0–36.0)
MCHC: 31.3 g/dL (ref 30.0–36.0)
MCV: 92.7 fL (ref 80.0–100.0)
MCV: 93.8 fL (ref 80.0–100.0)
Platelets: 188 10*3/uL (ref 150–400)
Platelets: 205 10*3/uL (ref 150–400)
RBC: 2.89 MIL/uL — ABNORMAL LOW (ref 3.87–5.11)
RBC: 3.08 MIL/uL — ABNORMAL LOW (ref 3.87–5.11)
RDW: 13.3 % (ref 11.5–15.5)
RDW: 13.3 % (ref 11.5–15.5)
WBC: 10 10*3/uL (ref 4.0–10.5)
WBC: 11 10*3/uL — ABNORMAL HIGH (ref 4.0–10.5)
nRBC: 0 % (ref 0.0–0.2)
nRBC: 0.2 % (ref 0.0–0.2)

## 2020-02-01 LAB — CULTURE, BETA STREP (GROUP B ONLY)

## 2020-02-01 LAB — MAGNESIUM: Magnesium: 5.1 mg/dL — ABNORMAL HIGH (ref 1.7–2.4)

## 2020-02-01 LAB — GROUP B STREP BY PCR: Group B strep by PCR: NEGATIVE

## 2020-02-01 MED ORDER — OXYTOCIN-SODIUM CHLORIDE 30-0.9 UT/500ML-% IV SOLN
1.0000 m[IU]/min | INTRAVENOUS | Status: DC
  Administered 2020-02-01: 2 m[IU]/min via INTRAVENOUS
  Filled 2020-02-01: qty 500

## 2020-02-01 MED ORDER — HYDRALAZINE HCL 20 MG/ML IJ SOLN: 10.0000 mg | Freq: Once | INTRAMUSCULAR | Status: DC

## 2020-02-01 MED ORDER — LACTATED RINGERS IV SOLN: INTRAVENOUS | Status: AC

## 2020-02-01 MED ORDER — DIPHENHYDRAMINE HCL 25 MG PO CAPS: 25.0000 mg | ORAL_CAPSULE | Freq: Four times a day (QID) | ORAL | Status: DC | PRN

## 2020-02-01 MED ORDER — PRENATAL MULTIVITAMIN CH
1.0000 | ORAL_TABLET | Freq: Every day | ORAL | Status: DC
  Administered 2020-02-02 – 2020-02-03 (×2): 1 via ORAL
  Filled 2020-02-01 (×2): qty 1

## 2020-02-01 MED ORDER — ACETAMINOPHEN 325 MG PO TABS: 650.0000 mg | ORAL_TABLET | ORAL | Status: DC | PRN

## 2020-02-01 MED ORDER — SIMETHICONE 80 MG PO CHEW: 80.0000 mg | CHEWABLE_TABLET | ORAL | Status: DC | PRN

## 2020-02-01 MED ORDER — WITCH HAZEL-GLYCERIN EX PADS: 1.0000 "application " | MEDICATED_PAD | CUTANEOUS | Status: DC | PRN

## 2020-02-01 MED ORDER — TETANUS-DIPHTH-ACELL PERTUSSIS 5-2.5-18.5 LF-MCG/0.5 IM SUSY
0.5000 mL | PREFILLED_SYRINGE | Freq: Once | INTRAMUSCULAR | Status: DC
Start: 2020-02-02 — End: 2020-02-01

## 2020-02-01 MED ORDER — MEASLES, MUMPS & RUBELLA VAC IJ SOLR: 0.5000 mL | Freq: Once | INTRAMUSCULAR | Status: DC

## 2020-02-01 MED ORDER — PENICILLIN G POTASSIUM 5000000 UNITS IJ SOLR: 3.0000 10*6.[IU] | INTRAMUSCULAR | Status: DC

## 2020-02-01 MED ORDER — ONDANSETRON HCL 4 MG/2ML IJ SOLN: 4.0000 mg | INTRAMUSCULAR | Status: DC | PRN

## 2020-02-01 MED ORDER — TERBUTALINE SULFATE 1 MG/ML IJ SOLN: 0.2500 mg | Freq: Once | INTRAMUSCULAR | Status: DC | PRN

## 2020-02-01 MED ORDER — COCONUT OIL OIL: 1.0000 "application " | TOPICAL_OIL | Status: DC | PRN

## 2020-02-01 MED ORDER — PENICILLIN G POT IN DEXTROSE 60000 UNIT/ML IV SOLN: 3.0000 10*6.[IU] | INTRAVENOUS | Status: DC

## 2020-02-01 MED ORDER — BENZOCAINE-MENTHOL 20-0.5 % EX AERO: 1.0000 "application " | INHALATION_SPRAY | CUTANEOUS | Status: DC | PRN

## 2020-02-01 MED ORDER — ONDANSETRON HCL 4 MG PO TABS: 4.0000 mg | ORAL_TABLET | ORAL | Status: DC | PRN

## 2020-02-01 MED ORDER — MAGNESIUM SULFATE 40 GM/1000ML IV SOLN
2.0000 g/h | INTRAVENOUS | Status: AC
  Administered 2020-02-02: 2 g/h via INTRAVENOUS
  Filled 2020-02-01: qty 1000

## 2020-02-01 MED ORDER — DIBUCAINE (PERIANAL) 1 % EX OINT: 1.0000 "application " | TOPICAL_OINTMENT | CUTANEOUS | Status: DC | PRN

## 2020-02-01 MED ORDER — MEDROXYPROGESTERONE ACETATE 150 MG/ML IM SUSP
150.0000 mg | INTRAMUSCULAR | Status: DC | PRN
  Filled 2020-02-01: qty 1

## 2020-02-01 MED ORDER — IBUPROFEN 600 MG PO TABS
600.0000 mg | ORAL_TABLET | Freq: Four times a day (QID) | ORAL | Status: DC
Start: 2020-02-01 — End: 2020-02-03
  Administered 2020-02-02 – 2020-02-03 (×3): 600 mg via ORAL
  Filled 2020-02-01 (×5): qty 1

## 2020-02-01 MED ORDER — POLYSACCHARIDE IRON COMPLEX 150 MG PO CAPS
150.0000 mg | ORAL_CAPSULE | Freq: Every day | ORAL | Status: DC
  Administered 2020-02-02 – 2020-02-03 (×2): 150 mg via ORAL
  Filled 2020-02-01 (×2): qty 1

## 2020-02-01 MED ORDER — NIFEDIPINE ER OSMOTIC RELEASE 30 MG PO TB24
30.0000 mg | ORAL_TABLET | Freq: Every day | ORAL | Status: DC
  Administered 2020-02-01 – 2020-02-03 (×3): 30 mg via ORAL
  Filled 2020-02-01 (×3): qty 1

## 2020-02-01 MED ORDER — SENNOSIDES-DOCUSATE SODIUM 8.6-50 MG PO TABS
2.0000 | ORAL_TABLET | ORAL | Status: DC
Start: 2020-02-01 — End: 2020-02-03
  Administered 2020-02-02: 2 via ORAL
  Filled 2020-02-01: qty 2

## 2020-02-01 MED ORDER — HYDRALAZINE HCL 20 MG/ML IJ SOLN: 10.0000 mg | Freq: Once | INTRAMUSCULAR | Status: AC

## 2020-02-01 NOTE — Progress Notes (Signed)
Labor Progress Note Evelyn Mcclain is a 25 y.o. [phone] at [redacted]w[redacted]d presented for IOL 1/5-preE w/ SF (BP).  S: Doing well without complaints. Sleeping upon entry into room. No headaches, vision changes, cp, sob.  O:  BP (!) 156/79   Pulse 81   Temp 98.3 F (36.8 C) (Oral)   Resp 17   Ht 5\' 7"  (1.702 m)   Wt 83.7 kg   LMP 04/08/2019 (Exact Date) Comment: had incomplete AB, went to OR, no period since, no F/U  SpO2 97%   BMI 28.91 kg/m  EFM: baseline 120 bpm/mod variability/+ accels/no decels Toco: q4-7 minutes  CVE: Dilation: 2 Effacement (%): 60 Station: -3 Presentation: Vertex (confirmed by ultrasound) Exam by:: [email or web address], CNM   A&P: 25 y.o. 22 [redacted]w[redacted]d presented for IOL 1/5-preE w/ SF (BP). #IOL: Started IOL 1/5 after patient found to have severe range BP in office and then again upon presentation to MAU. S/p cyto x6. FB dislodged with this check. RN will check cervix and start pitocin when able. #Pain: epidural #FWB: cat 1 #GBS negative PCR, PCN discontinued  #preE w/ SF: based on multiple severe range BP. On Mg and anti-htn protocol. No signs of Mg toxicity. Asymptomatic. Repeat preE labs and Mg level unremarkable. Continue to monitor.  #Hyperglycemia: BGL on CMP 150, patient passed 1hr gtt on admission. Will recheck in 4 hours, patient has been drinking soda.  #history of polysubstance abuse: UDS on admit +THC #Grief: FOB murdered a few months ago, SW postpartum  [redacted]w[redacted]d, MD 1:37 AM

## 2020-02-01 NOTE — Discharge Summary (Signed)
Postpartum Discharge Summary  Date of Service updated 02/03/20     Patient Name: Evelyn Mcclain DOB: 27-Jun-1995 MRN: [id number]  Date of admission: 01/30/2020 Delivery date:02/01/2020  Delivering provider: Julianne Handler  Date of discharge: 02/03/2020  Admitting diagnosis: Preeclampsia, severe, third trimester [O14.13] Intrauterine pregnancy: [redacted]w[redacted]d    Secondary diagnosis:  Active Problems:   SVD (spontaneous vaginal delivery)   Preeclampsia, severe, third trimester  Additional problems: hx of PTD, THC use in pregnancy, hx of PEC/HELLP    Discharge diagnosis: Preterm Pregnancy Delivered and Preeclampsia (severe)                                              Post partum procedures:Magnesiun x 24 hrs Augmentation: Pitocin, Cytotec and IP Foley Complications: None  Hospital course: Induction of Labor With Vaginal Delivery   25y.o. yo [phone] 26w2das admitted to the hospital 01/30/2020 for induction of labor.  Indication for induction: Preeclampsia.  Patient had an uncomplicated labor course as follows: Membrane Rupture Time/Date: 8:49 AM ,02/01/2020   Delivery Method:Vaginal, Spontaneous  Episiotomy: None  Lacerations:  None  Details of delivery can be found in separate delivery note.  Patient had a routine postpartum course. She received magnesium x 24 hrs and was started on Procardia with good BP control. She received Depo Provera for birth control on day of discharge. She progressed to ambulating, voiding, tolerating diet and good oral pain control. Felt amendable for discharge home. Discharge instructions, follow up and medications reviewed with pt. Pt verbalized understanding.   Patient is discharged home 02/03/20.  Newborn Data: Birth date:02/01/2020  Birth time:10:03 AM  Gender:Female  Living status:Living  Apgars:6 ,10  Weight:2211 g   Magnesium Sulfate received: Yes: Seizure prophylaxis BMZ received: Yes Rhophylac:N/A MMR:N/A T-DaP:Given prenatally Flu:  No Transfusion:No  Physical exam  Vitals:   02/02/20 1605 02/02/20 2100 02/03/20 0002 02/03/20 0304  BP: (!) 147/73 137/70 (!) 141/77 138/70  Pulse: 100 (!) 108 93 83  Resp: 18 20 20 18   Temp: 98.1 F (36.7 C) 97.9 F (36.6 C) 98.4 F (36.9 C) 98.3 F (36.8 C)  TempSrc: Oral Oral Oral Oral  SpO2: 99% 99% 99% 100%  Weight:      Height:       General: alert Lochia: appropriate Uterine Fundus: firm Incision: Healing well with no significant drainage DVT Evaluation: No evidence of DVT seen on physical exam. Labs: Lab Results  Component Value Date   WBC 9.8 02/02/2020   HGB 7.8 (L) 02/02/2020   HCT 25.9 (L) 02/02/2020   MCV 94.5 02/02/2020   PLT 217 02/02/2020   CMP Latest Ref Rng & Units 01/31/2020  Glucose 70 - 99 mg/dL 150(H)  BUN 6 - 20 mg/dL 7  Creatinine 0.44 - 1.00 mg/dL 0.82  Sodium 135 - 145 mmol/L 134(L)  Potassium 3.5 - 5.1 mmol/L 4.4  Chloride 98 - 111 mmol/L 105  CO2 22 - 32 mmol/L 19(L)  Calcium 8.9 - 10.3 mg/dL 7.5(L)  Total Protein 6.5 - 8.1 g/dL 6.3(L)  Total Bilirubin 0.3 - 1.2 mg/dL 0.4  Alkaline Phos 38 - 126 U/L 112  AST 15 - 41 U/L 21  ALT 0 - 44 U/L 13   Edinburgh Score: Edinburgh Postnatal Depression Scale Screening Tool 02/03/2020  I have been able to laugh and see the funny side of  things. 0  I have looked forward with enjoyment to things. 0  I have blamed myself unnecessarily when things went wrong. 0  I have been anxious or worried for no good reason. 0  I have felt scared or panicky for no good reason. 0  Things have been getting on top of me. 0  I have been so unhappy that I have had difficulty sleeping. 0  I have felt sad or miserable. 0  I have been so unhappy that I have been crying. 0  The thought of harming myself has occurred to me. 0  Edinburgh Postnatal Depression Scale Total 0     After visit meds:  Allergies as of 02/11/20      Reactions   Septra [bactrim] Rash      Medication List    STOP taking these medications    Mag-Oxide 200 MG Tabs Generic drug: Magnesium Oxide   promethazine 25 MG tablet Commonly known as: PHENERGAN     TAKE these medications   Blood Pressure Kit Devi 1 Device by Does not apply route daily.   ferrous sulfate 325 (65 FE) MG tablet Take 1 tablet (325 mg total) by mouth daily with breakfast.   ibuprofen 600 MG tablet Commonly known as: ADVIL Take 1 tablet (600 mg total) by mouth every 6 (six) hours.   NIFEdipine 30 MG 24 hr tablet Commonly known as: PROCARDIA-XL/NIFEDICAL-XL Take 1 tablet (30 mg total) by mouth daily.      Discharge home in stable condition Infant Feeding: Bottle Infant Disposition:home with mother Discharge instruction: per After Visit Summary and Postpartum booklet. Activity: Advance as tolerated. Pelvic rest for 6 weeks.  Diet: routine diet Future Appointments: Future Appointments  Date Time Provider [place]  02/08/2020  9:20 AM [facility] NURSE [facility]  02/13/2020  9:45 AM [facility]  03/14/2020  8:35 AM Anyanwu, Sallyanne Havers, MD [facility]   Follow up Visit:  [place] [facility] Follow up.   Specialty: Obstetrics and Gynecology Why: 1 week for BP check 4 weeks for PP visit Contact information: [address] [place] [address] [phone]              Please schedule this patient for Postpartum visit in: 6 weeks with the following provider: Any provider In-person For C/S patients schedule nurse incision check in weeks 2 weeks: no High risk pregnancy complicated by: HTN Delivery mode:  SVD Anticipated Birth Control:  Depo PP Procedures needed: BP check  Schedule Integrated [place] visit: yes-recent death of FOB   2020-02-11 Chancy Milroy, MD

## 2020-02-01 NOTE — Progress Notes (Signed)
Labor Progress Note Evelyn Mcclain is a 25 y.o. [phone] at [redacted]w[redacted]d presented for IOL 1/5-preE w/ SF (BP).  S: Doing well without complaints. Sleeping upon entry into room. No headaches, vision changes, cp, sob.  O:  BP (!) 152/90   Pulse 90   Temp 98.6 F (37 C) (Oral)   Resp 17   Ht 5\' 7"  (1.702 m)   Wt 83.7 kg   LMP 04/08/2019 (Exact Date) Comment: had incomplete AB, went to OR, no period since, no F/U  SpO2 97%   BMI 28.91 kg/m  EFM: baseline 120 bpm/mod variability/+ accels/no decels Toco: q1-5 minutes  CVE: Dilation: 5 Effacement (%): 50 Cervical Position: Posterior Station: -3 Presentation: Vertex Exam by:: Irais Mottram   A&P: 24 y.o. [email or web address] [redacted]w[redacted]d presented for IOL 1/5-preE w/ SF (BP). #IOL: Started IOL 1/5 after patient found to have severe range BP in office and then again upon presentation to MAU. S/p cyto x6 and FB. Pitocin started at 0230, continue to titrate. Given fetal station AROM deferred. #Pain: epidural #FWB: cat 1 #GBS negative PCR, PCN previously discontinued  #preE w/ SF: based on multiple severe range BP. On Mg and anti-htn protocol. No signs of Mg toxicity. Asymptomatic. Repeat preE labs and Mg level unremarkable. Continue to monitor.  #Hyperglycemia: BGL on CMP 150, patient passed 1hr gtt on admission. q4 BGL checks.  #history of polysubstance abuse: UDS on admit +THC #Grief: FOB murdered a few months ago, SW postpartum  [redacted]w[redacted]d, MD 7:20 AM

## 2020-02-01 NOTE — Progress Notes (Signed)
I offered support to [facility] after delivery. The father of her children was murdered 3 months ago and she shared about how different it was to go through this delivery than the delivery of their 26 year old when he was there to support her. She stated that she is focusing on doing one thing at a time.  I affirmed this plan and encouraged self-care.  [address] Dyanne Carrel, Bcc Pager, [phone] 3:57 PM

## 2020-02-01 NOTE — Anesthesia Postprocedure Evaluation (Signed)
Anesthesia Post Note  Patient: Evelyn Mcclain  Procedure(s) Performed: AN AD HOC LABOR EPIDURAL     Patient location during evaluation: [facility] Anesthesia Type: Epidural Level of consciousness: awake and alert, oriented and patient cooperative Pain management: pain level controlled Vital Signs Assessment: post-procedure vital signs reviewed and stable Respiratory status: spontaneous breathing Cardiovascular status: stable Postop Assessment: no headache, epidural receding, patient able to bend at knees and no signs of nausea or vomiting Anesthetic complications: no Comments: Pt. States she is walking.  No c/o pain.    No complications documented.  Last Vitals:  Vitals:   02/01/20 1500 02/01/20 1600  BP: 133/75   Pulse: 81   Resp: 18 18  Temp: 36.7 C   SpO2: 100%     Last Pain:  Vitals:   02/01/20 1500  TempSrc: Oral  PainSc:    Pain Goal:                   [facility]

## 2020-02-02 LAB — CBC
HCT: 25.9 % — ABNORMAL LOW (ref 36.0–46.0)
Hemoglobin: 7.8 g/dL — ABNORMAL LOW (ref 12.0–15.0)
MCH: 28.5 pg (ref 26.0–34.0)
MCHC: 30.1 g/dL (ref 30.0–36.0)
MCV: 94.5 fL (ref 80.0–100.0)
Platelets: 217 10*3/uL (ref 150–400)
RBC: 2.74 MIL/uL — ABNORMAL LOW (ref 3.87–5.11)
RDW: 13.6 % (ref 11.5–15.5)
WBC: 9.8 10*3/uL (ref 4.0–10.5)
nRBC: 0.9 % — ABNORMAL HIGH (ref 0.0–0.2)

## 2020-02-02 NOTE — Clinical Social Work Maternal (Signed)
CLINICAL SOCIAL WORK MATERNAL/CHILD NOTE  Patient Details  Name: Evelyn Mcclain MRN: [id number] Date of Birth: 1995-12-10  Date:  02/02/2020  Clinical Social Worker Initiating Note:  Abundio Miu, [place] Date/Time: Initiated:  02/02/20/1549     Child's Name:  Evelyn Mcclain   Biological Parents:  Mother,Father (Father: Evelyn Mcclain)   Need for Interpreter:  None   Reason for Referral:  Current Substance Use/Substance Use During Pregnancy ,Late or No Prenatal Care ,Grief and Loss  (Limited prenatal care)   Address: Mailing Address: [address] [place] [address]    Physical Address: [address] [place] [address]   Phone number:  [phone] (home)     Additional phone number:   Household Members/Support Persons (HM/SP):   Household Member/Support Person 1   HM/SP Name Relationship DOB or Age  HM/SP -1 Evelyn Mcclain daughter 08/12/17  HM/SP -2        HM/SP -3        HM/SP -4        HM/SP -5        HM/SP -6        HM/SP -7        HM/SP -8          Natural Supports (not living in the home):  Immediate Family,Parent,Extended Family   Professional Supports: None   Employment: Ship broker   Type of Work:     Education:  Attending college   Homebound arranged:    Museum/gallery curator Resources:  Medicaid   Other Resources:  Physicist, medical ,[place] Considerations Which May Impact Care:    Strengths:  Home prepared for child ,Ability to meet basic needs ,Pediatrician chosen   Psychotropic Medications:         Pediatrician:    [place] area  Pediatrician List:   [facility] [place]      Pediatrician Fax Number:    Risk Factors/Current Problems:  Substance Use    Cognitive State:  Alert ,Able to Concentrate ,Goal Oriented ,Insightful ,Linear Thinking    Mood/Affect:  Calm ,Interested    CSW  Assessment: CSW met with MOB at bedside to discuss recent loss, limited prenatal care and substance use during pregnancy. MOB was sitting up in bed and holding infant. CSW introduced self and explained reason for consult. MOB was calm and remained engaged during assessment. MOB reported that she resides with daughter only after FOB's passing. CSW offered condolences and inquired about how MOB was coping with loss. MOB reported that she has been spending time with FOB's family. CSW inquired about MOB's interest in grief counseling resources, MOB declined and reported that she was not interested. MOB reported that she is a full time student and is looking for a job working from home. MOB reported that she receives both [facility] and food stamps. MOB reported that she has all items needed to care for infant including a car seat and basinet. CSW inquired about MOB's support system, MOB reported that her mom, FOB's mom and FOB's grandma are supports. MOB shared that she has a good support system.   CSW inquired about MOB's mental health history. MOB denied any mental health history. MOB denied any history of postpartum depression. CSW inquired about how MOB was feeling emotionally after giving birth, MOB reported that she missed FOB noting that it  was easier with FOB. CSW acknowledged and validated MOB's feelings of missing FOB. CSW asked MOB again if she was interested in grief counseling resources, MOB declined. MOB presented calm and did not demonstrate any acute mental health signs/symptoms. CSW assessed for safety, MOB denied SI, HI and domestic violence.   CSW provided education regarding the baby blues period vs. perinatal mood disorders, discussed treatment and gave resources for mental health follow up if concerns arise.  CSW recommends self-evaluation during the postpartum time period using the New Mom Checklist from Postpartum Progress and encouraged MOB to contact a medical professional if symptoms are noted at  any time.    CSW provided review of Sudden Infant Death Syndrome (SIDS) precautions.    CSW informed MOB about the hospital drug screen policy due to limited prenatal care and documented substance use. MOB confirmed limited prenatal care, CSW inquired about barriers to prenatal care. MOB reported that grief and dealing with grief were barriers, CSW acknowledged and validated MOB's experience. CSW asked if MOB would have any barriers with getting infant to pediatrician appointments, MOB reported none. MOB confirmed marijuana use during pregnancy, noting last use was a few months ago. MOB denied any additional substance use during pregnancy. CSW informed MOB that infant's UDS was negative and CDS would continue to be monitored and a CPS report would be made if warranted. MOB verbalized understanding and endorsed CPS history for the same reason when she had her daughter. MOB reported that the case was in [facility] and was closed. MOB denied any questions/concerns.   CSW identifies no further need for intervention and no barriers to discharge at this time.  CSW Plan/Description:  Sudden Infant Death Syndrome (SIDS) Education,Perinatal Mood and Anxiety Disorder (PMADs) Education,No Further Intervention Required/No Barriers to [place] Information,CSW Will Continue to Monitor Umbilical Cord Tissue Drug Screen Results and Make Report if Barbette Or, LCSW 02/02/2020, 3:52 PM

## 2020-02-02 NOTE — Progress Notes (Signed)
Post Partum Day 1 Subjective: no complaints and tolerating PO  Objective: Blood pressure 136/80, pulse 77, temperature 97.8 F (36.6 C), temperature source Oral, resp. rate 18, height 5\' 7"  (1.702 m), weight 83.7 kg, last menstrual period 04/08/2019, SpO2 99 %, unknown if currently breastfeeding.  Physical Exam:  General: alert, cooperative and no distress Lochia: appropriate Uterine Fundus: firm Incision: n/a DVT Evaluation: No evidence of DVT seen on physical exam.  Recent Labs    01/31/20 2346 02/01/20 1109  HGB 8.4* 8.9*  HCT 26.8* 28.9*    Assessment/Plan: Magnesium to d/c after 24 hr pp   LOS: 3 days   03/31/20 02/02/2020, 7:16 AM

## 2020-02-03 MED ORDER — NIFEDIPINE ER OSMOTIC RELEASE 30 MG PO TB24: 30.0000 mg | ORAL_TABLET | Freq: Every day | ORAL | 1 refills | Status: DC

## 2020-02-03 MED ORDER — IBUPROFEN 600 MG PO TABS: 600.0000 mg | ORAL_TABLET | Freq: Four times a day (QID) | ORAL | 1 refills | Status: DC

## 2020-02-03 NOTE — Progress Notes (Signed)
Pt has stated to nurse several times throughout the night that this experience is a lot different than her last baby because the father of the baby was murdered 3 months ago. I asked if she would like to talk more about that and she replied "it ain't going to change nothing. I knew he shouldn't have left so early in the morning that day". Pt had flat affect through all of night. She did express gratitude with help changing the baby's diaper and feeding the baby. She also told me that she is going to be all alone with her newborn and 26 year old after discharge. I encouraged pt to fill out [place] and when pt was completing it she kept saying "I don't want y'all all up in my business, so I put 0 for everything."

## 2020-02-03 NOTE — Progress Notes (Signed)
RN went in to room and patient asking to see the pediatrician.Pt stated she did not want to see anyone else come in to her room unless it was the pediatrician.  Dr. Erik Obey on the unit seeing another patient and was made aware.

## 2020-02-03 NOTE — Discharge Instructions (Signed)

## 2020-02-03 NOTE — Progress Notes (Signed)
Pt given discharge paperwork. All questions answered. Pt's baby brought up to the nursery. Pt went up with RN to see where the nursery was. Instructed to keep her arm band to match baby with at all times when she visits 5th floor nursery.

## 2020-02-03 NOTE — Progress Notes (Signed)
Patient ID: Evelyn Mcclain, female   DOB: 02-09-1995, 25 y.o.   MRN: [id number] Attending Circumcision Counseling Progress Note  Patient desires circumcision for her female infant.  Circumcision procedure details discussed, risks and benefits of procedure were also discussed.  These include but are not limited to: Benefits of circumcision in men include reduction in the rates of urinary tract infection (UTI), penile cancer, some sexually transmitted infections, penile inflammatory and retractile disorders, as well as easier hygiene.  Risks include bleeding , infection, injury of glans which may lead to penile deformity or urinary tract issues, unsatisfactory cosmetic appearance and other potential complications related to the procedure.  It was emphasized that this is an elective procedure.  Patient wants to proceed with circumcision; written informed consent obtained.  Will do circumcision soon, routine circumcision and post circumcision care ordered for the infant.  Yohana Bartha L. Alysia Penna, M.D. 02/03/2020 6:53 AM

## 2020-02-03 NOTE — Progress Notes (Signed)
RN rounded on patient. Pt asking for the pediatrician again. Pt stated," I don't understand why we haven't been seen yet. It's almost 12." Pt made aware that pediatrician is rounding on multiple patients and that her son would be seen today. Pt asking to leave but was made aware of pending speech consult for her newborn. Pt unhappy to be receiving updates from RN instead of MD. Dr. Erik Obey notified again and stated she will see the patient.

## 2020-02-06 ENCOUNTER — Encounter: Payer: Medicaid Other | Admitting: Family Medicine

## 2020-02-07 ENCOUNTER — Ambulatory Visit: Payer: Medicaid Other

## 2020-02-08 ENCOUNTER — Ambulatory Visit: Payer: Medicaid Other

## 2020-02-12 NOTE — BH Specialist Note (Signed)
Integrated Behavioral Health via Telemedicine Visit  02/12/2020 [name] [id number]  Number of Integrated Behavioral Health visits: 1 Session Start time: 10:07 Session End time: 10:21 Total time: 14  Referring Provider: Candelaria Celeste, DO Patient/Family location: [facility] Provider location: [facility]  All persons participating in visit: Patient Evelyn Mcclain and [facility] Phoua Hoadley   Types of Service: Telephone visit  I connected with Lendell Caprice and/or Jerilynn Mages Ruhland's n/a by Telephone  (Video is Caregility application) and verified that I am speaking with the correct person using two identifiers.Discussed confidentiality: Yes   I discussed the limitations of telemedicine and the availability of in person appointments.  Discussed there is a possibility of technology failure and discussed alternative modes of communication if that failure occurs.  I discussed that engaging in this telemedicine visit, they consent to the provision of behavioral healthcare and the services will be billed under their insurance.  Patient and/or legal guardian expressed understanding and consented to Telemedicine visit: Yes   Presenting Concerns: Patient and/or family reports the following symptoms/concerns: Pt states she is coping well with recent loss of FOB, with the help of supportive friends and family; is sleeping and eating well postpartum; pt's only concern is need to add baby to EBT and MyChart.  Duration of problem: since loss   Patient and/or Family's Strengths/Protective Factors: Social connections, Social and Emotional competence, Concrete supports in place (healthy food, safe environments, etc.) and Sense of purpose  Goals Addressed: Patient will:  1.  Demonstrate ability to: Increase healthy adjustment to current life circumstances and Continue healthy grieving over loss  Progress towards  Goals: Achieved  Interventions: Interventions utilized:  Psychoeducation and/or Health Education and Link to [employer] Standardized Assessments completed: GAD-7 and PHQ 9  Patient and/or Family Response: Pt agrees to treatment plan  Assessment: Patient currently experiencing Grief.   Patient may benefit from brief supportive counseling today .  Plan: 1. Follow up with behavioral health clinician on : Call Reef Achterberg at [phone] as needed 2. Behavioral recommendations:  -Continue prioritizing healthy sleeping and eating postpartum -Continue with plan to add baby to EBT and MyChart -Consider additional grief support to share with friends/family, as needed (on After Visit Summary) -Consider new mom online support as needed (on AVS) 3. Referral(s): Integrated Art gallery manager (In Clinic) and [employer]:  New mom support; grief support  I discussed the assessment and treatment plan with the patient and/or parent/guardian. They were provided an opportunity to ask questions and all were answered. They agreed with the plan and demonstrated an understanding of the instructions.   They were advised to call back or seek an in-person evaluation if the symptoms worsen or if the condition fails to improve as anticipated.  Valetta Close Brynne Doane, LCSW

## 2020-02-13 ENCOUNTER — Telehealth: Payer: Self-pay | Admitting: Clinical

## 2020-02-13 ENCOUNTER — Ambulatory Visit: Payer: Medicaid Other | Admitting: Clinical

## 2020-02-13 ENCOUNTER — Other Ambulatory Visit: Payer: Self-pay

## 2020-02-13 DIAGNOSIS — F4321 Adjustment disorder with depressed mood: Secondary | ICD-10-CM

## 2020-02-13 NOTE — Patient Instructions (Addendum)
[facility] at [facility] [address] [place], [place] [address] [phone] (main office) [phone] (Nicci Vaughan's office)  MyChart Help Desk : [phone]  New mom support groups online:  Www.postpartum.net Www.conehealthybaby.com  [facility] (Individual and group grief support)  [facility]Gerre Scull  [phone]   primary care offices accepting new patients:   Primary Care at [facility] [address] [place], [place] [address] [phone]  [facility] at [facility] [address] [place], [place] [address] [phone]  [facility] [address] [place], [place] [address] [phone]  [facility] [address] [place], [place] [address] [phone]  [facility] [address] [place],  [place]  [address] [phone]

## 2020-03-03 NOTE — Telephone Encounter (Signed)
error 

## 2020-03-14 ENCOUNTER — Ambulatory Visit: Payer: Medicaid Other | Admitting: Obstetrics & Gynecology

## 2020-06-27 IMAGING — US US OB < 14 WEEKS - US OB TV
1 series · 15 of 28 positions shown · non-contrast
Comparison: Prior gestational ultrasound 08/09/2017

CLINICAL DATA: Six weeks post medical therapy for abortion, concern
for RPOC

EXAM:
OBSTETRIC <14 WK ULTRASOUND
TECHNIQUE: Transabdominal ultrasound was performed for evaluation of the
gestation as well as the maternal uterus and adnexal regions.

[Series 1: us ob < 14 weeks - us ob tv · 30 acquisitions, 15 frames shown]
[im 1/30]
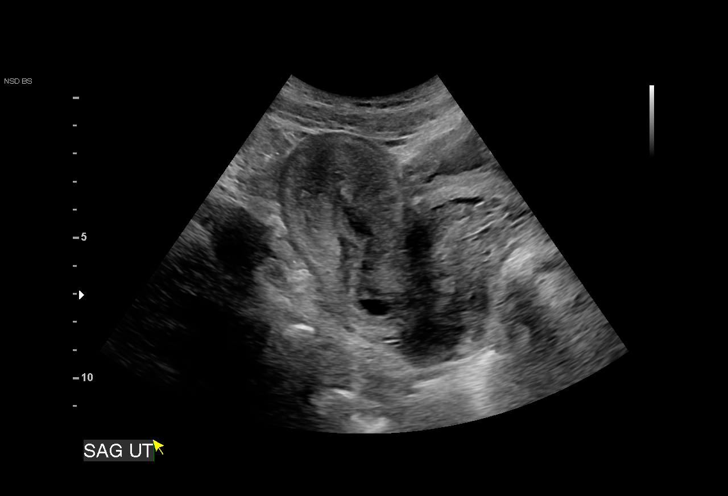
[im 3/30]
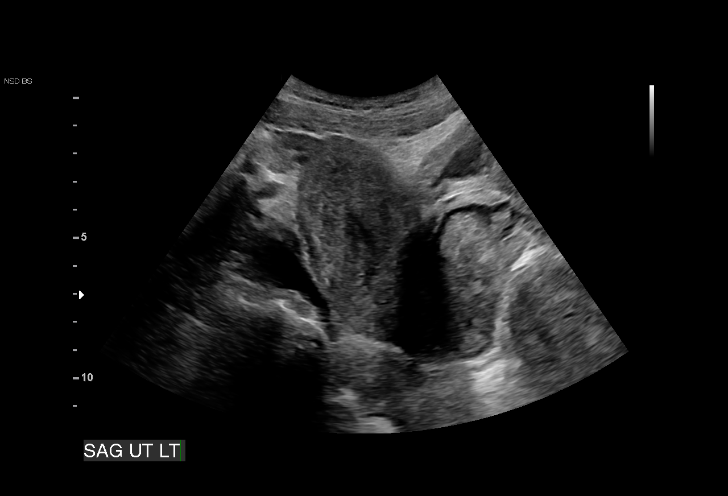
[im 5/30]
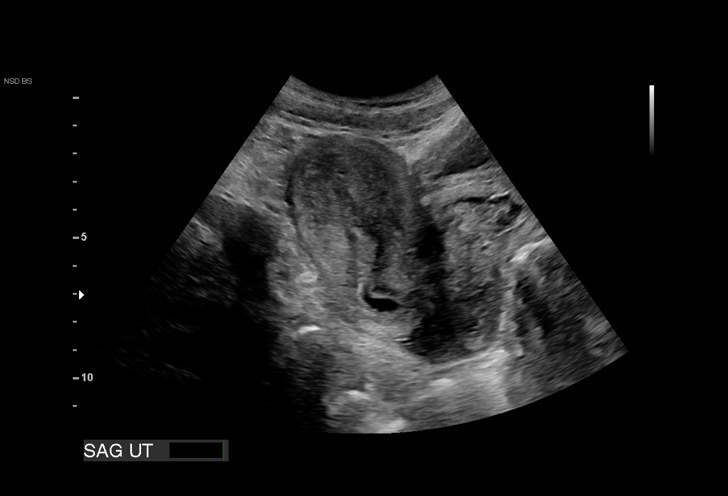
[im 7/30]
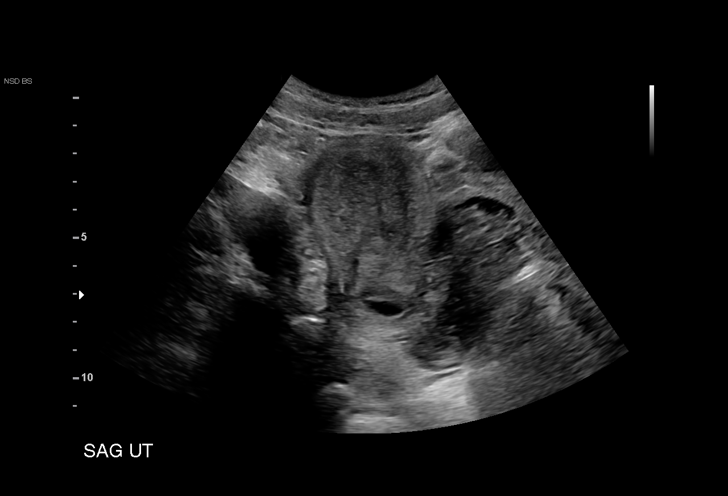
[im 9/30]
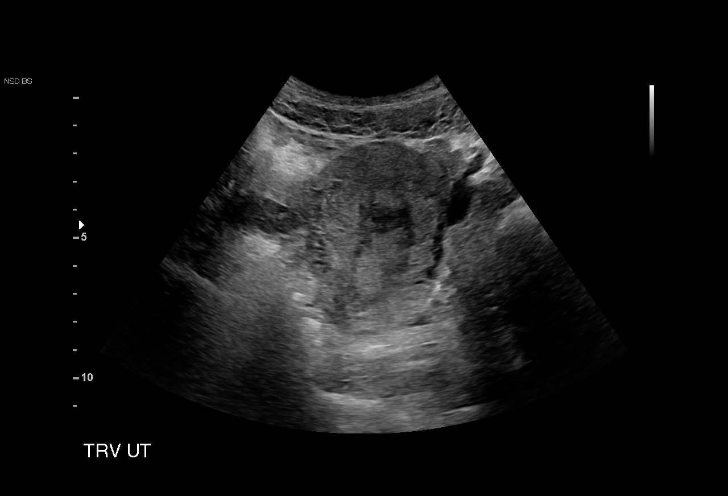
[im 11/30]
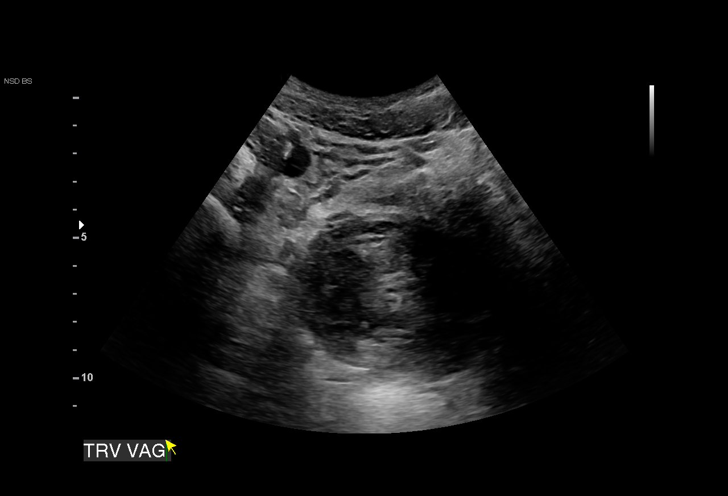
[im 13/30]
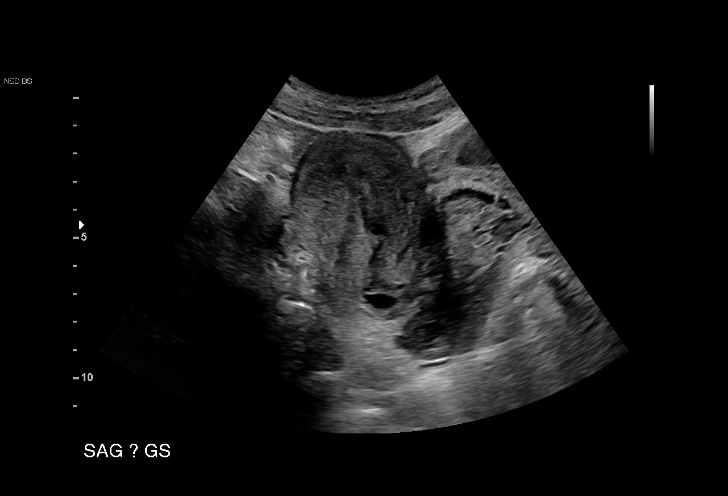
[im 16/30]
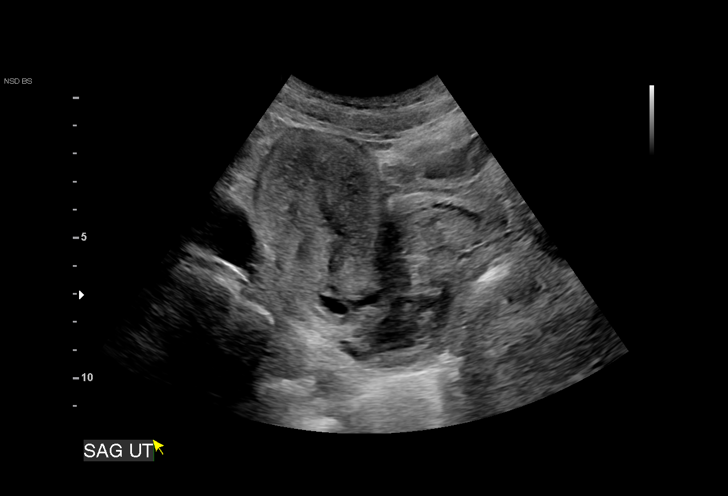
[im 17/30]
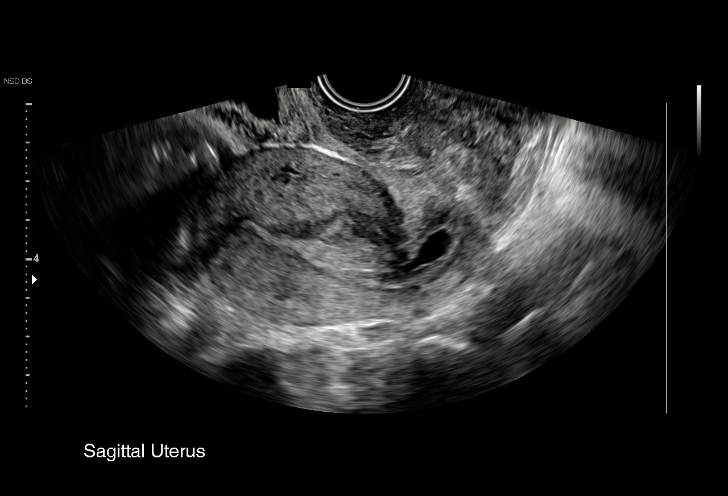
[im 19/30]
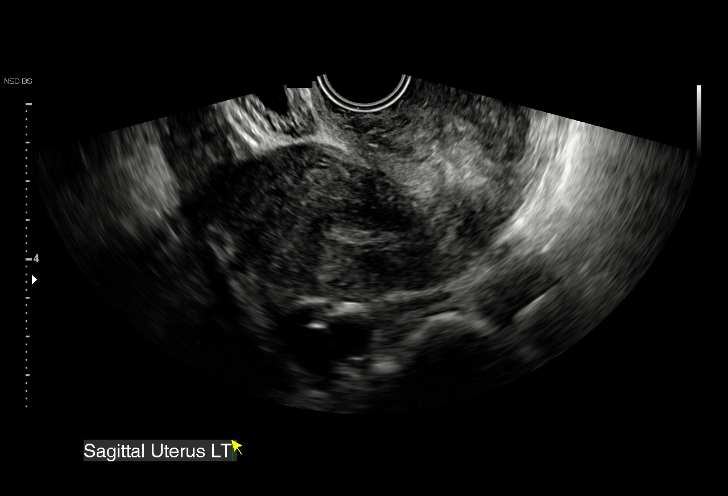
[im 21/30]
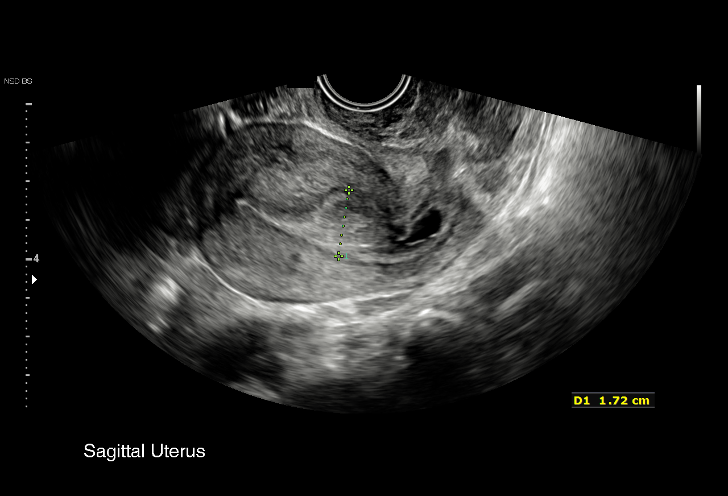
[im 23/30]
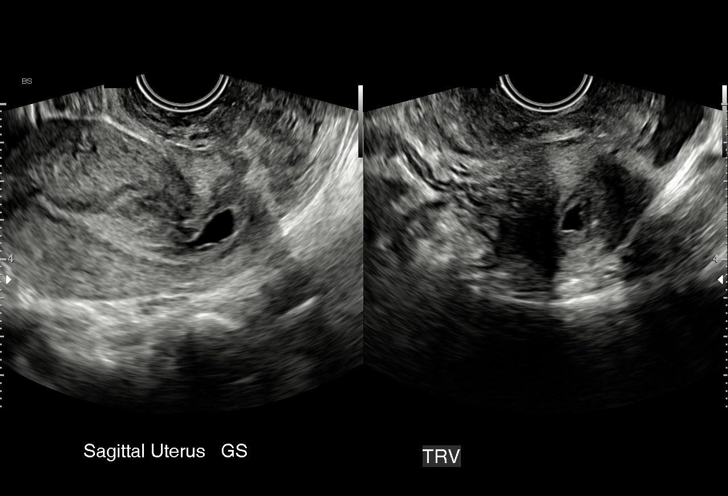
[im 25/30]
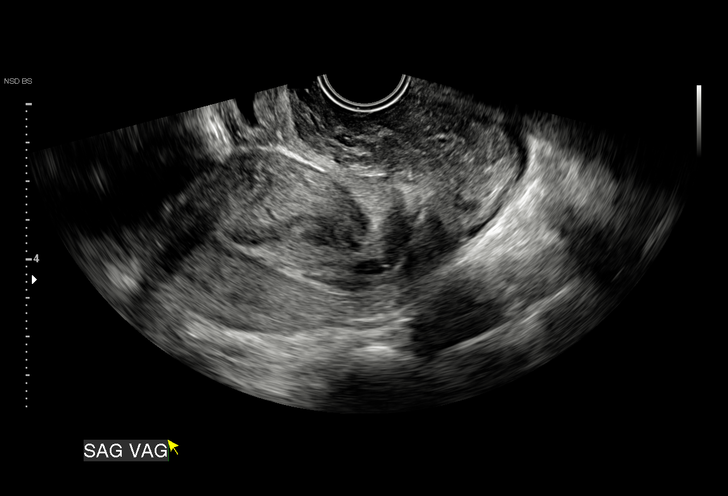
[im 27/30]
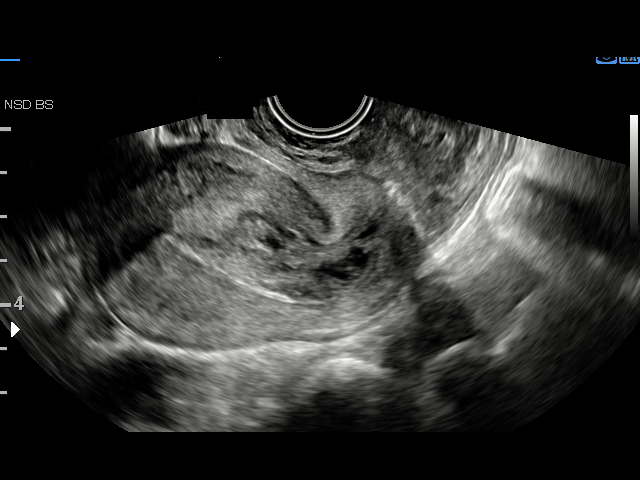
[im 30/30]
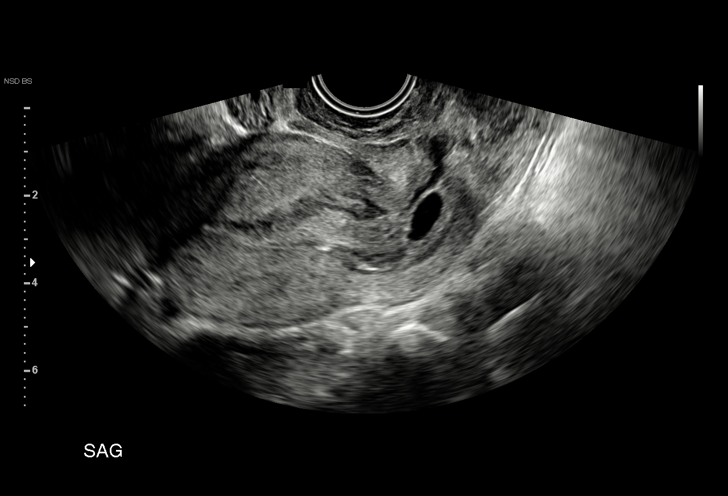

[15 of 28 positions shown; findings below may reference images not displayed]

FINDINGS: Intrauterine gestational sac: Anechoic gestational sac is seen in
the lower uterine segment.

Yolk sac:  Not Visualized.

Embryo:  Not Visualized.

Cardiac Activity: Not Visualized.

MSD:  5.42 mm   5 w   2 d

Subchorionic hemorrhage:  None visualized.

Maternal uterus/adnexae: Anechoic gestational sac appears positioned
in the lower uterine segment. The fundal endometrium also appears
thickened at 17.2 mm with an avascular appearance on color Doppler.
Large amount of hemorrhagic material is seen within the vaginal
canal. No free fluid in the pelvis.
IMPRESSION: There is retention of gestational sac with residual endometrial
thickening up to 17.2 mm. Findings are compatible with an incomplete
abortion. Large amount of hemorrhagic material noted within the
vaginal canal.

## 2020-08-21 ENCOUNTER — Ambulatory Visit (HOSPITAL_COMMUNITY)
Admission: EM | Admit: 2020-08-21 | Discharge: 2020-08-21 | Disposition: A | Discharge status: Home / Self Care | Payer: Medicaid Other | Attending: Internal Medicine | Admitting: Internal Medicine

## 2020-08-21 ENCOUNTER — Encounter (HOSPITAL_COMMUNITY): Payer: Self-pay

## 2020-08-21 ENCOUNTER — Other Ambulatory Visit: Payer: Self-pay

## 2020-08-21 DIAGNOSIS — N898 Other specified noninflammatory disorders of vagina: Secondary | ICD-10-CM | POA: Insufficient documentation

## 2020-08-21 MED ORDER — METRONIDAZOLE 500 MG PO TABS: 500.0000 mg | ORAL_TABLET | Freq: Two times a day (BID) | ORAL | 0 refills | Status: AC

## 2020-08-21 NOTE — ED Notes (Signed)
Unable to process Urinalysis. Tried to run test strip 4 time but due to that amount of blood in urine it would not process. Urine Culture was collected along with a Swab.

## 2020-08-21 NOTE — Discharge Instructions (Addendum)
Your  STD swab that test for BV and other organisms is pending.  Unable to read urine test due to you being on your menstrual cycle.  We will empirically treat with antibiotics for BV.  Please take as prescribed.  We will call if any of these tests are positive.  Go the hospital if you develop any pelvic pain or lower abdominal pain that is severe.

## 2020-08-21 NOTE — ED Provider Notes (Signed)
[facility]    CSN: [id number] Arrival date & time: 08/21/20  7564      History   Chief Complaint Chief Complaint  Patient presents with   Vaginal Discharge    HPI Evelyn Mcclain is a 25 y.o. female.   Patient presents to the urgent care today for 1 month history of white vaginal discharge that has a fishy odor.  Denies any pelvic pain or lower abdominal pain.  Denies any known fevers.  Denies any urinary frequency or burning.  Patient is currently on her menstrual cycle.  Denies any known exposure to STD.  Patient states that she thinks this is bacterial vaginosis because she has had BV previously, and the symptoms are very similar.   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    Patient Active Problem List   Diagnosis Date Noted   SVD (spontaneous vaginal delivery) 02/01/2020   Preeclampsia, severe, third trimester 02/01/2020   History of preterm delivery 02/01/2020   Substance abuse affecting pregnancy, antepartum 02/01/2020   Anemia affecting pregnancy in third trimester 01/30/2020   Supervision of high risk pregnancy, antepartum 10/09/2019   History of HELLP syndrome, currently pregnant 08/15/2017   History of gestational hypertension 08/08/2017   UTI (urinary tract infection) during pregnancy, second trimester 05/12/2017   Marijuana use 03/15/2017   Polysubstance abuse ([place]) 08/30/2011    Past Surgical History:  Procedure Laterality Date   DILATION AND CURETTAGE OF UTERUS N/A 04/07/2019   Procedure: SUCTION DILATATION AND CURETTAGE;  Surgeon: Debbrah Alar, MD;  Location: [place];  Service: Gynecology;  Laterality: N/A;   NO PAST SURGERIES      OB History     Gravida  4   Para  2   Term  0    Preterm  2   AB  2   Living  2      SAB  0   IAB  2   Ectopic  0   Multiple  0   Live Births  2            Home Medications    Prior to Admission medications   Medication Sig Start Date End Date Taking? Authorizing Provider  metroNIDAZOLE (FLAGYL) 500 MG tablet Take 1 tablet (500 mg total) by mouth 2 (two) times daily for 7 days. 08/21/20 08/28/20 Yes Odis Luster, FNP  Blood Pressure Monitoring (BLOOD PRESSURE KIT) DEVI 1 Device by Does not apply route daily. Patient not taking: No sig reported 10/09/19   Gabriel Carina, CNM  ferrous sulfate 325 (65 FE) MG tablet Take 1 tablet (325 mg total) by mouth daily with breakfast. Patient not taking: No sig reported 04/08/19   Debbrah Alar, MD  ibuprofen (ADVIL) 600 MG tablet Take 1 tablet (600 mg total) by mouth every 6 (six) hours. 02/03/20   Chancy Milroy, MD  NIFEdipine (PROCARDIA-XL/NIFEDICAL-XL) 30 MG 24 hr tablet Take 1 tablet (30 mg total) by mouth daily. 02/03/20   Chancy Milroy, MD  labetalol (NORMODYNE) 200 MG tablet Take 4 tablets (800 mg total) by mouth 3 (three) times daily. Patient not taking: Reported on 01/06/2019 08/17/17 01/06/19  Aletha Halim, MD    Family  History Family History  Problem Relation Age of Onset   Hypertension Mother    Hypertension Maternal Aunt    Hypertension Maternal Uncle    Hypertension Maternal Grandmother    Asthma Maternal Grandmother    Hypertension Maternal Grandfather    Diabetes Paternal Grandmother    Diabetes Paternal Grandfather    Asthma Brother     Social History Social History   Tobacco Use   Smoking status: Some Days    Packs/day: 0.25    Types: Cigarettes   Smokeless tobacco: Never  Vaping Use   Vaping Use: Never used  Substance Use Topics   Alcohol use: No   Drug use: Not Currently    Types: Marijuana    Comment: June 2019     Allergies   Septra [bactrim]   Review of Systems Review of Systems Per HPI  Physical Exam Triage Vital  Signs ED Triage Vitals  Enc Vitals Group     BP 08/21/20 0942 (!) 155/94     Pulse Rate 08/21/20 0942 60     Resp 08/21/20 0942 18     Temp 08/21/20 0942 97.8 F (36.6 C)     Temp Source 08/21/20 0942 Oral     SpO2 08/21/20 0942 98 %     Weight --      Height --      Head Circumference --      Peak Flow --      Pain Score 08/21/20 0940 0     Pain Loc --      Pain Edu? --      Excl. in [place]? --    No data found.  Updated Vital Signs BP (!) 155/94 (BP Location: Left Arm)   Pulse 60   Temp 97.8 F (36.6 C) (Oral)   Resp 18   SpO2 98%   Breastfeeding No   Visual Acuity Right Eye Distance:   Left Eye Distance:   Bilateral Distance:    Right Eye Near:   Left Eye Near:    Bilateral Near:     Physical Exam Constitutional:      Appearance: Normal appearance.  HENT:     Head: Normocephalic and atraumatic.  Eyes:     Extraocular Movements: Extraocular movements intact.     Conjunctiva/sclera: Conjunctivae normal.  Pulmonary:     Effort: Pulmonary effort is normal.  Genitourinary:    Comments: Deferred with shared decision making.  Self swab performed. Skin:    General: Skin is warm and dry.  Neurological:     General: No focal deficit present.     Mental Status: She is alert and oriented to person, place, and time. Mental status is at baseline.  Psychiatric:        Mood and Affect: Mood normal.        Behavior: Behavior normal.        Thought Content: Thought content normal.        Judgment: Judgment normal.     UC Treatments / Results  Labs (all labs ordered are listed, but only abnormal results are displayed) Labs Reviewed  URINE CULTURE  POCT URINALYSIS DIPSTICK, ED / UC  CERVICOVAGINAL ANCILLARY ONLY    EKG   Radiology No results found.  Procedures Procedures (including critical care time)  Medications Ordered in UC Medications - No data to display  Initial Impression / Assessment and Plan / UC Course  I have reviewed the triage vital signs  and the nursing notes.  Pertinent labs &  imaging results that were available during my care of the patient were reviewed by me and considered in my medical decision making (see chart for details).     Will treat empirically for bacterial vaginosis with metronidazole x7 days due to patient's history and current clinical symptoms.  STD vaginal swab pending.  Attempted to obtain urinalysis and urine culture, but patient's current menstrual bleeding caused urinalysis machine to not be able to result.  Low suspicion for UTI given patient's clinical symptoms.  Patient advised to go to the hospital if any pelvic pain, abdominal pain, fever develops. Discussed strict return precautions. Patient verbalized understanding and is agreeable with plan.  Final Clinical Impressions(s) / UC Diagnoses   Final diagnoses:  Vaginal discharge     Discharge Instructions      Your urine culture and STD swabs that test for BV and other organisms are pending.  We will empirically treat with antibiotics.  Please take as prescribed.  We will call if any of these tests are positive.  Go the hospital if you develop any pelvic pain or lower abdominal pain that is severe.     ED Prescriptions     Medication Sig Dispense Auth. Provider   metroNIDAZOLE (FLAGYL) 500 MG tablet Take 1 tablet (500 mg total) by mouth 2 (two) times daily for 7 days. 14 tablet Odis Luster, FNP      PDMP not reviewed this encounter.   Odis Luster, FNP 08/21/20 1026

## 2020-08-21 NOTE — ED Triage Notes (Signed)
Pt reports white vaginal discharge x 1 month.  

## 2020-08-22 LAB — URINE CULTURE

## 2020-08-22 LAB — CERVICOVAGINAL ANCILLARY ONLY
Bacterial Vaginitis (gardnerella): POSITIVE — AB
Candida Glabrata: NEGATIVE
Candida Vaginitis: NEGATIVE
Chlamydia: NEGATIVE
Comment: NEGATIVE
Comment: NEGATIVE
Comment: NEGATIVE
Comment: NEGATIVE
Comment: NEGATIVE
Comment: NORMAL
Neisseria Gonorrhea: NEGATIVE
Trichomonas: NEGATIVE

## 2021-01-08 IMAGING — US US MFM OB DETAIL+14 WK
1 series · 13 of 28 positions shown · non-contrast
Comparison: none

[Series 1: us mfm ob detail+14 wk · 63 acquisitions, 13 frames shown]
[im 3/63]
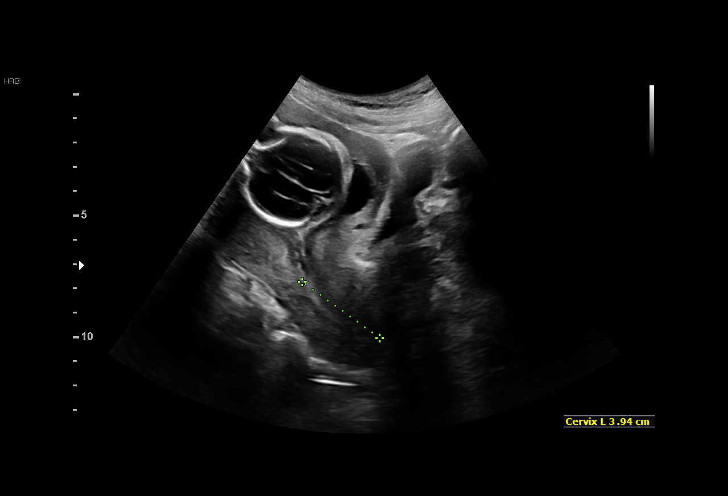
[im 7/63]
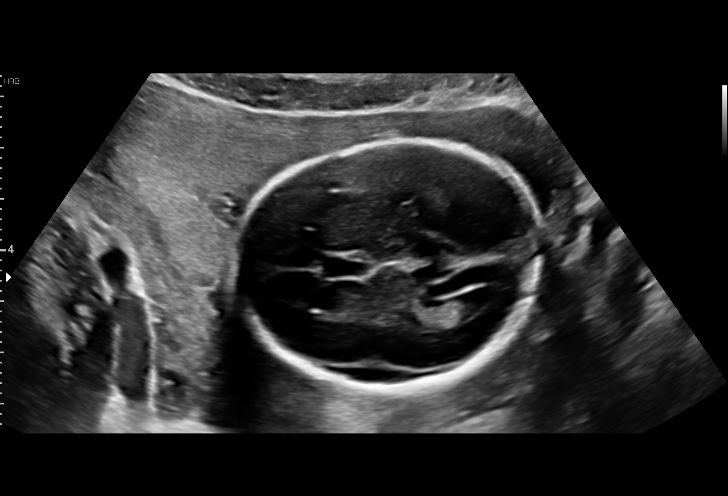
[im 12/63]
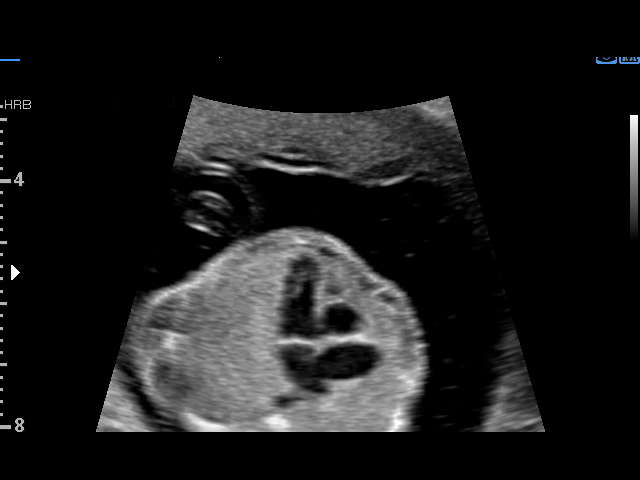
[im 17/63]
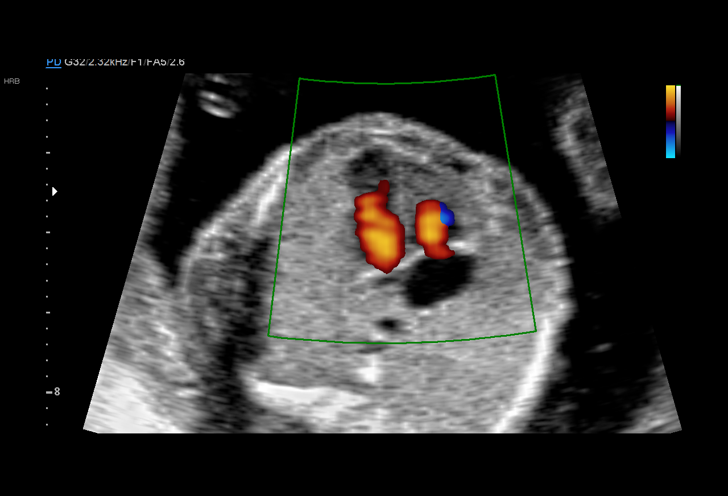
[im 21/63]
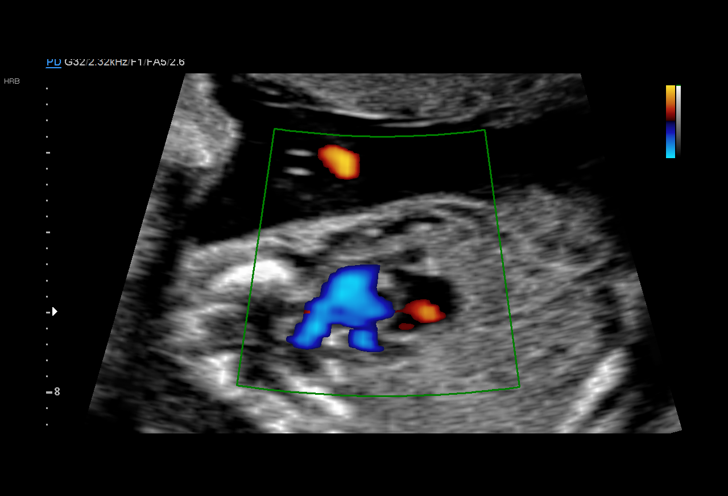
[im 26/63]
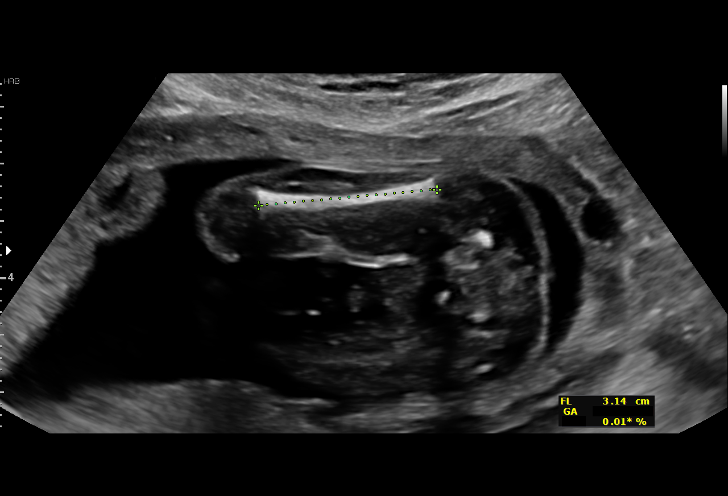
[im 33/63]
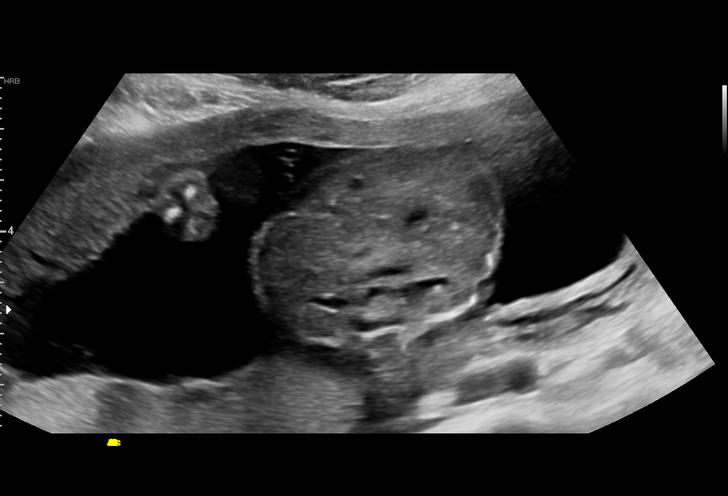
[im 37/63]
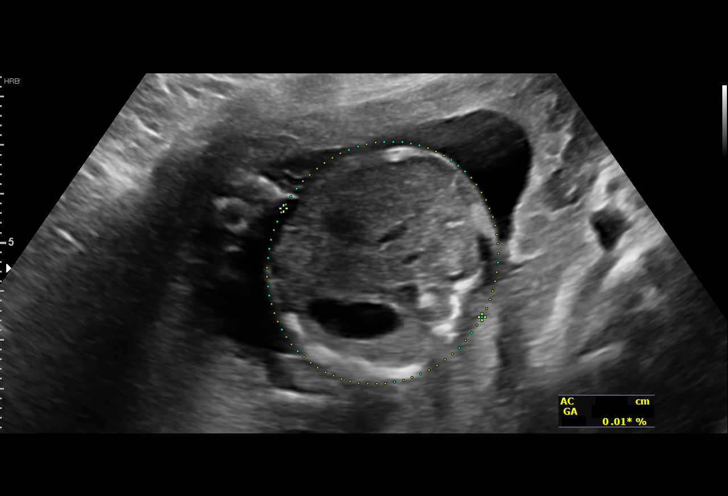
[im 42/63]
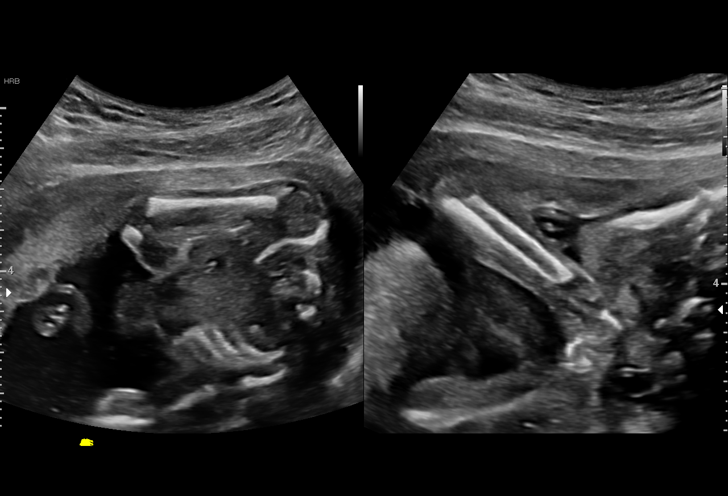
[im 46/63]
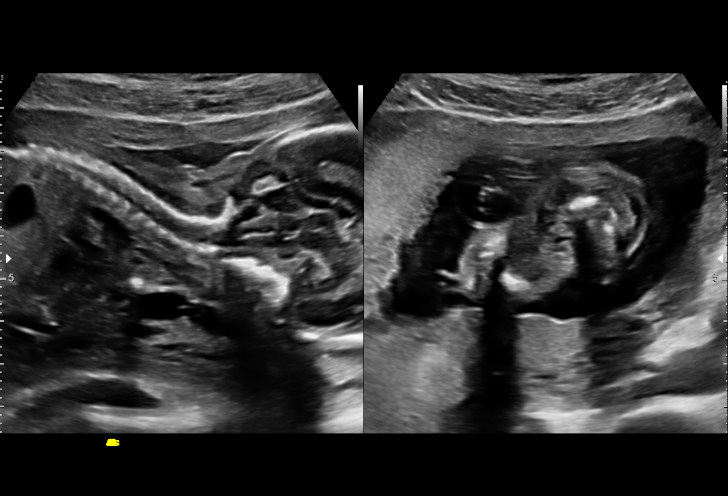
[im 51/63]
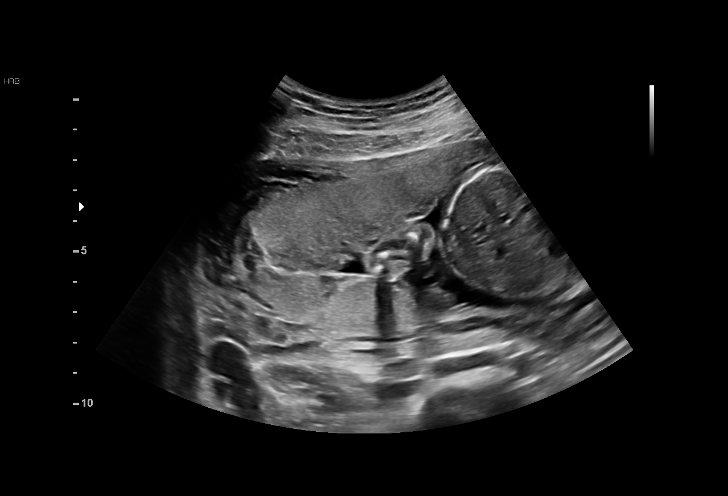
[im 56/63]
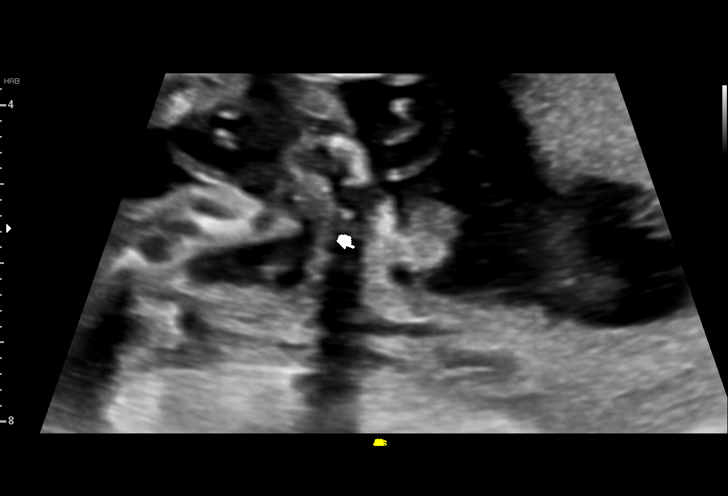
[im 60/63]
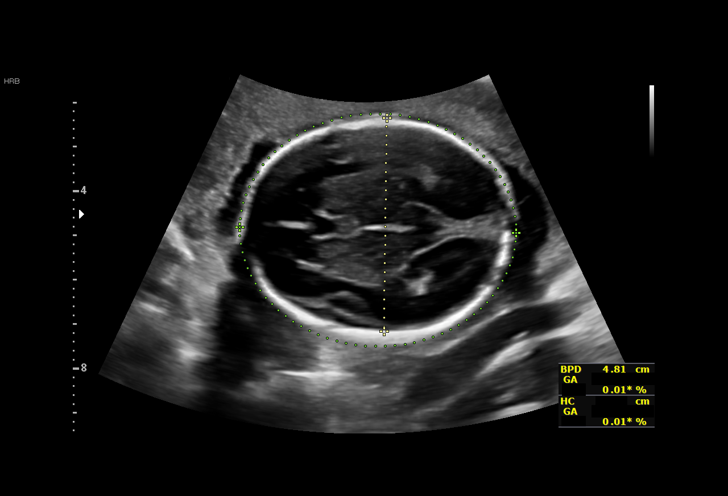

[13 of 28 positions shown; findings below may reference images not displayed]

[REDACTED]care
                   WALKER CNM

Indications

 Encounter for antenatal screening for
 malformations
 Substance abuse affecting pregnancy,
 antepartum
 Encounter for uncertain dates
 20 weeks gestation of pregnancy
Fetal Evaluation

 Num Of Fetuses:         1
 Fetal Heart Rate(bpm):  154
 Cardiac Activity:       Observed
 Presentation:           Cephalic
 Placenta:               Right lateral
 P. Cord Insertion:      Visualized, central

 Amniotic Fluid
 AFI FV:      Within normal limits

                             Largest Pocket(cm)

Biometry

 BPD:      48.2  mm     G. Age:  20w 4d         62  %    CI:        72.89   %    70 - 86
                                                         FL/HC:      17.4   %    16.8 -
 HC:      179.5  mm     G. Age:  20w 3d         46  %    HC/AC:      1.17        1.09 -
 AC:      153.2  mm     G. Age:  20w 4d         52  %    FL/BPD:     64.7   %
 FL:       31.2  mm     G. Age:  19w 5d         22  %    FL/AC:      20.4   %    20 - 24
 CER:      21.3  mm     G. Age:  20w 1d         67  %
 NFT:       5.3  mm

 LV:        5.3  mm
 CM:          3  mm

 Est. FW:     337  gm    0 lb 12 oz      39  %
OB History

 Gravidity:    4         Term:   1        Prem:   1
 TOP:          2        Living:  1
Gestational Age

 U/S Today:     20w 2d                                        EDD:   03/05/20
 Best:          20w 2d     Det. By:  U/S (10/19/19)           EDD:   03/05/20
Anatomy

 Cranium:               Appears normal         Aortic Arch:            Appears normal
 Cavum:                 Appears normal         Ductal Arch:            Appears normal
 Ventricles:            Appears normal         Diaphragm:              Appears normal
 Choroid Plexus:        Appears normal         Stomach:                Appears normal, left
                                                                       sided
 Cerebellum:            Appears normal         Abdomen:                Appears normal
 Posterior Fossa:       Appears normal         Abdominal Wall:         Appears nml (cord
                                                                       insert, abd wall)
 Nuchal Fold:           Appears normal         Cord Vessels:           Appears normal (3
                                                                       vessel cord)
 Face:                  Appears normal         Kidneys:                Appear normal
                        (orbits and profile)
 Lips:                  Appears normal         Bladder:                Appears normal
 Thoracic:              Appears normal         Spine:                  Appears normal
 Heart:                 Appears normal         Upper Extremities:      Appears normal
                        (4CH, axis, and
                        situs)
 RVOT:                  Appears normal         Lower Extremities:      Appears normal
 LVOT:                  Appears normal

 Other:  Fetus appears to be a male. Heels visualized.
Cervix Uterus Adnexa

 Cervix
 Length:           3.94  cm.
 Normal appearance by transabdominal scan.

 Uterus
 No abnormality visualized.

 Right Ovary
 Within normal limits.

 Left Ovary
 Within normal limits.

 Cul De Sac
 No free fluid seen.
 Adnexa
 No abnormality visualized.
Impression

 G4 P1.  Patient is here for fetal anatomy scan.  She is unsure
 of her LMP date.  She had TOP in March 2019.
 Obstetric history significant for preterm vaginal delivery at 34
 weeks gestation.  Her pregnancy was complicated by
 preeclampsia with severe features.
 She reports no chronic medical conditions.

 We performed fetal anatomy scan. No makers of
 aneuploidies or fetal structural defects are seen. Fetal
 biometry is consistent with her previously-established dates.
 Amniotic fluid is normal and good fetal activity is seen.
 Patient understands the limitations of ultrasound in detecting
 fetal anomalies.
 I discussed the importance of low-dose aspirin prophylaxis to
 prevent or delay recurrence of preeclampsia.  I also
 discussed screening for aneuploidies.

 We have assigned her EDD at 03/05/2020.
Recommendations

 -An appointment was made for her to return in 4 weeks for
 fetal growth assessment to confirm gestational age.
                 Usz, Dawidzior

## 2021-09-19 ENCOUNTER — Encounter (HOSPITAL_COMMUNITY): Payer: Self-pay | Admitting: *Deleted

## 2021-09-19 ENCOUNTER — Other Ambulatory Visit: Payer: Self-pay

## 2021-09-19 ENCOUNTER — Ambulatory Visit (HOSPITAL_COMMUNITY)
Admission: EM | Admit: 2021-09-19 | Discharge: 2021-09-19 | Disposition: A | Discharge status: Home / Self Care | Payer: Medicaid Other | Attending: Family Medicine | Admitting: Family Medicine

## 2021-09-19 DIAGNOSIS — K0889 Other specified disorders of teeth and supporting structures: Secondary | ICD-10-CM

## 2021-09-19 MED ORDER — AMOXICILLIN-POT CLAVULANATE 875-125 MG PO TABS: 1.0000 | ORAL_TABLET | Freq: Two times a day (BID) | ORAL | 0 refills | Status: DC

## 2021-09-19 MED ORDER — IBUPROFEN 800 MG PO TABS: 800.0000 mg | ORAL_TABLET | Freq: Three times a day (TID) | ORAL | 0 refills | Status: DC

## 2021-09-19 NOTE — ED Triage Notes (Signed)
Pt reports she has a dental infection and needs a anti-bx.

## 2021-09-21 NOTE — ED Provider Notes (Signed)
[facility]   [id number] 09/19/21 Arrival Time: 1020  ASSESSMENT & PLAN:  1. Pain, dental    No sign of abscess requiring I&D at this time. Discussed.  Meds ordered this encounter  Medications   amoxicillin-clavulanate (AUGMENTIN) 875-125 MG tablet    Sig: Take 1 tablet by mouth every 12 (twelve) hours.    Dispense:  20 tablet    Refill:  0   ibuprofen (ADVIL) 800 MG tablet    Sig: Take 1 tablet (800 mg total) by mouth 3 (three) times daily with meals.    Dispense:  21 tablet    Refill:  0   Dental resource written instructions given. She will schedule dental evaluation as soon as possible if not improving over the next 24-48 hours.  Reviewed expectations re: course of current medical issues. Questions answered. Outlined signs and symptoms indicating need for more acute intervention. Patient verbalized understanding. After Visit Summary given.   SUBJECTIVE:  Evelyn Mcclain is a 26 y.o. female who reports gradual onset of right upper dental pain described as aching. Present for sev days. Fever: absent. Tolerating PO intake but reports pain with chewing. Normal swallowing. She does not see a dentist regularly. No neck swelling or pain. OTC analgesics without relief.  OBJECTIVE: Vitals:   09/19/21 1128  BP: 119/72  Pulse: (!) 58  Resp: 18  Temp: 99.1 F (37.3 C)  SpO2: 99%    General appearance: alert; no distress HENT: normocephalic; atraumatic; dentition: fair; right upper gum without areas of fluctuance, drainage, or bleeding and with tenderness to palpation; normal jaw movement without difficulty Neck: supple without LAD; FROM; trachea midline Lungs: normal respirations; unlabored; speaks full sentences without difficulty Skin: warm and dry Psychological: alert and cooperative; normal mood and affect  Allergies  Allergen Reactions   Septra [Bactrim] Rash    Past Medical History:  Diagnosis Date   Anemia    Chlamydia    Heart murmur    Hx of  seasonal allergies    Infection    uti   Pregnancy induced hypertension    Subarachnoid hemorrhage following injury (HCC) 08/30/2011   Subdural hematoma, 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/2011   Social History   Socioeconomic History   Marital status: Single    Spouse name: Not on file   Number of children: Not on file   Years of education: Not on file   Highest education level: Not on file  Occupational History   Not on file  Tobacco Use   Smoking status: Some Days    Packs/day: 0.25    Types: Cigarettes   Smokeless tobacco: Never  Vaping Use   Vaping Use: Never used  Substance and Sexual Activity   Alcohol use: No   Drug use: Not Currently    Types: Marijuana    Comment: June 2019   Sexual activity: Yes    Birth control/protection: Injection  Other Topics Concern   Not on file  Social History Narrative   Father of the baby was murdered in Fall 2021   Social Determinants of Health   Financial Resource Strain: Not on file  Food Insecurity: Not on file  Transportation Needs: Not on file  Physical Activity: Not on file  Stress: Not on file  Social Connections: Not on file  Intimate Partner Violence: Not on file   Family History  Problem Relation Age of Onset   Hypertension Mother    Hypertension Maternal Aunt    Hypertension Maternal Uncle    Hypertension  Maternal Grandmother    Asthma Maternal Grandmother    Hypertension Maternal Grandfather    Diabetes Paternal Grandmother    Diabetes Paternal Grandfather    Asthma Brother    Past Surgical History:  Procedure Laterality Date   DILATION AND CURETTAGE OF UTERUS N/A 04/07/2019   Procedure: SUCTION DILATATION AND CURETTAGE;  Surgeon: Catalina Pizza, MD;  Location: [facility] OR;  Service: Gynecology;  Laterality: N/A;   NO PAST SURGERIES        Mardella Layman, MD 09/21/21 1256

## 2021-09-29 ENCOUNTER — Inpatient Hospital Stay (HOSPITAL_COMMUNITY)
Admission: AD | Admit: 2021-09-29 | Discharge: 2021-09-29 | Disposition: A | Discharge status: Home / Self Care | Payer: Medicaid Other | Attending: Obstetrics and Gynecology | Admitting: Obstetrics and Gynecology

## 2021-09-29 ENCOUNTER — Other Ambulatory Visit: Payer: Self-pay

## 2021-09-29 DIAGNOSIS — Z01812 Encounter for preprocedural laboratory examination: Secondary | ICD-10-CM | POA: Insufficient documentation

## 2021-09-29 DIAGNOSIS — Z3202 Encounter for pregnancy test, result negative: Secondary | ICD-10-CM | POA: Diagnosis not present

## 2021-09-29 DIAGNOSIS — Z789 Other specified health status: Secondary | ICD-10-CM

## 2021-09-29 LAB — POCT PREGNANCY, URINE: Preg Test, Ur: NEGATIVE

## 2021-09-29 NOTE — MAU Provider Note (Addendum)
Event Date/Time   First Provider Initiated Contact with Patient 09/29/21 1844      S Ms. Evelyn Mcclain is a 26 y.o. [phone] patient who presents to MAU today with complaint of vaginal bleeding. Patient had a negative UPT at home yesterday. She denies saturating more than 1 pad per hour and endorses small clots. Patient previously had a traumatic TAB that resulted in a "hemorrhage" and D&C and patient is very worried. She is 2 weeks overdue for follow up depo injection.    O BP (!) 142/79 (BP Location: Right Arm)   Pulse 77   Temp 98.6 F (37 C) (Oral)   Resp 18   Ht 5\' 7"  (1.702 m)   Wt 68.3 kg   LMP 09/19/2021   SpO2 100%   BMI 23.59 kg/m  Physical Exam Vitals and nursing note reviewed.  Constitutional:      General: She is not in acute distress.    Appearance: Normal appearance.  HENT:     Head: Normocephalic.  Pulmonary:     Effort: Pulmonary effort is normal.  Musculoskeletal:     Cervical back: Normal range of motion.  Skin:    General: Skin is warm and dry.  Neurological:     Mental Status: She is alert and oriented to person, place, and time.  Psychiatric:        Mood and Affect: Mood normal.    Results for orders placed or performed during the hospital encounter of 09/29/21 (from the past 24 hour(s))  Pregnancy, urine POC     Status: None   Collection Time: 09/29/21  6:33 PM  Result Value Ref Range   Preg Test, Ur NEGATIVE NEGATIVE   A Medical screening exam complete Negative UPT in MAU Patient not currently Pregnant Dx onset of Menses.   P - Discussed that this is likely onset of menses in the presence of overdue birth control.  - Patient reassured and states she is currently having depo injection refilled and that she just needs to pick up Rx.  - She does not desire pregnancy at this time. Recommended to use other contraception methods while refilling depo.  - Discharge from MAU in stable condition - Warning signs for worsening condition that would  warrant emergency follow-up discussed - Patient may return to MAU as needed   11/29/21, Carlynn Herald 09/29/2021 6:44 PM

## 2021-09-29 NOTE — MAU Note (Signed)
Evelyn Mcclain is a 26 y.o. at Unknown here in MAU reporting: she's having VB that began 2 days ago.  Reports has VB and passing blood clots.  Reports isn't saturating through a sanitary napkin or changing a pad every hour.  Reports had a negative HPT yesterday and is due for her Depo shot 2 weeks ago. LMP: N/A Onset of complaint: 2 days ago Pain score: 0 Vitals:   09/29/21 1824  BP: (!) 142/79  Pulse: 77  Resp: 18  Temp: 98.6 F (37 C)  SpO2: 100%     FHT:N/A Lab orders placed from triage:   UPT

## 2022-07-27 ENCOUNTER — Encounter (HOSPITAL_COMMUNITY): Payer: Self-pay | Admitting: Obstetrics and Gynecology

## 2022-07-27 ENCOUNTER — Inpatient Hospital Stay (HOSPITAL_COMMUNITY): Payer: Medicaid Other

## 2022-07-27 ENCOUNTER — Inpatient Hospital Stay (HOSPITAL_COMMUNITY)
Admission: AD | Admit: 2022-07-27 | Discharge: 2022-07-27 | Disposition: A | Discharge status: Home / Self Care | Payer: Medicaid Other | Attending: Obstetrics and Gynecology | Admitting: Obstetrics and Gynecology

## 2022-07-27 DIAGNOSIS — Z3201 Encounter for pregnancy test, result positive: Secondary | ICD-10-CM | POA: Insufficient documentation

## 2022-07-27 DIAGNOSIS — O26891 Other specified pregnancy related conditions, first trimester: Secondary | ICD-10-CM | POA: Diagnosis not present

## 2022-07-27 DIAGNOSIS — Z3A01 Less than 8 weeks gestation of pregnancy: Secondary | ICD-10-CM | POA: Diagnosis not present

## 2022-07-27 DIAGNOSIS — O219 Vomiting of pregnancy, unspecified: Secondary | ICD-10-CM | POA: Diagnosis not present

## 2022-07-27 DIAGNOSIS — O09291 Supervision of pregnancy with other poor reproductive or obstetric history, first trimester: Secondary | ICD-10-CM | POA: Diagnosis not present

## 2022-07-27 LAB — WET PREP, GENITAL
Sperm: NONE SEEN
Trich, Wet Prep: NONE SEEN
WBC, Wet Prep HPF POC: <10 (ref <10)
Yeast Wet Prep HPF POC: NONE SEEN

## 2022-07-27 LAB — URINALYSIS, ROUTINE W REFLEX MICROSCOPIC
Bilirubin Urine: NEGATIVE
Glucose, UA: NEGATIVE mg/dL
Ketones, ur: NEGATIVE mg/dL
Leukocytes,Ua: NEGATIVE
Nitrite: NEGATIVE
Protein, ur: NEGATIVE mg/dL
Specific Gravity, Urine: 1.017 (ref 1.005–1.030)
pH: 6 (ref 5.0–8.0)

## 2022-07-27 LAB — POCT PREGNANCY, URINE: Preg Test, Ur: POSITIVE — AB

## 2022-07-27 MED ORDER — PROMETHAZINE HCL 12.5 MG PO TABS: 12.5000 mg | ORAL_TABLET | Freq: Four times a day (QID) | ORAL | 1 refills | Status: DC | PRN

## 2022-07-27 MED ORDER — PROCHLORPERAZINE MALEATE 10 MG PO TABS
10.0000 mg | ORAL_TABLET | Freq: Once | ORAL | Status: AC
  Administered 2022-07-27: 10 mg via ORAL
  Filled 2022-07-27: qty 1

## 2022-07-27 MED ORDER — ASPIRIN 81 MG PO TBEC: 81.0000 mg | DELAYED_RELEASE_TABLET | Freq: Every day | ORAL | 2 refills | Status: AC

## 2022-07-27 MED ORDER — ACETAMINOPHEN 500 MG PO TABS
1000.0000 mg | ORAL_TABLET | Freq: Once | ORAL | Status: AC
Start: 2022-07-27 — End: 2022-07-27
  Administered 2022-07-27: 1000 mg via ORAL
  Filled 2022-07-27: qty 2

## 2022-07-27 MED ORDER — PRENATAL VITAMIN PLUS LOW IRON 27-1 MG PO TABS: 1.0000 | ORAL_TABLET | Freq: Every day | ORAL | 4 refills | Status: DC

## 2022-07-27 NOTE — MAU Provider Note (Signed)
MAU Provider Note  History  [id number]  Arrival date and time: 07/27/22 1717   Chief Complaint  Patient presents with   Nausea   Fatigue     HPI Evelyn Mcclain is a 27 y.o. [id number] at Unknown with PMHx notable for hx HELLP, Pre-E, who presents for possible pregnancy, nauseas/vomiting, decreased PO intake. Positive pregnancy test. Very emotional and considering possible termination.  She would like resources, though she is not sure what she would like to do.  She does feel very nauseous and can barely keep any food down.  She has a history of HELLP syndrome, anemia, and she remembers being really sick and is worried that she will be even sicker this pregnancy, will happen to take care of 2 children.  It seems like her level of support is very limited.  Vaginal bleeding: No LOF: No Fetal Movement: No Contractions: No     OB History  Gravida Para Term Preterm AB Living  5 2 0 2 2 2   SAB IAB Ectopic Multiple Live Births  0 2 0 0 2    # Outcome Date GA Lbr Len/2nd Weight Sex Delivery Anes PTL Lv  5 Current           4 Preterm 02/01/20 [redacted]w[redacted]d 07:52 / 00:11 2211 g M Vag-Spont EPI  LIV  3 IAB 2021          2 IAB 2020          1 Preterm 08/12/17 [redacted]w[redacted]d 02:05 / 00:10 1740 g F Vag-Spont EPI  LIV     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    Past Surgical History:  Procedure Laterality Date   DILATION AND CURETTAGE OF UTERUS N/A 04/07/2019   Procedure: SUCTION DILATATION AND CURETTAGE;  Surgeon: Catalina Pizza, MD;  Location: [facility] OR;  Service: Gynecology;  Laterality: N/A;   NO PAST SURGERIES      Family History  Problem Relation Age of Onset   Hypertension Mother    Hypertension Maternal Aunt    Hypertension Maternal Uncle    Hypertension Maternal  Grandmother    Asthma Maternal Grandmother    Hypertension Maternal Grandfather    Diabetes Paternal Grandmother    Diabetes Paternal Grandfather    Asthma Brother     Social History   Socioeconomic History   Marital status: Single    Spouse name: Not on file   Number of children: Not on file   Years of education: Not on file   Highest education level: Not on file  Occupational History   Not on file  Tobacco Use   Smoking status: Some Days    Packs/day: .25    Types: Cigarettes   Smokeless tobacco: Never  Vaping Use   Vaping Use: Never used  Substance and Sexual Activity   Alcohol use: No   Drug use: Not Currently    Types: Marijuana    Comment: June 2019   Sexual activity: Yes    Birth control/protection: Injection  Other Topics Concern   Not on file  Social History Narrative   Father of the baby was murdered in Fall 2021   Social Determinants of Health   Financial Resource Strain: Not on file  Food Insecurity: Not on file  Transportation Needs: Not on file  Physical Activity: Not on file  Stress: Not on file  Social Connections: Not on file  Intimate Partner Violence: Not on file    Allergies  Allergen Reactions   Septra [Bactrim] Rash    No current facility-administered medications on file prior to encounter.   Current Outpatient Medications on File Prior to Encounter  Medication Sig Dispense Refill   Blood Pressure Monitoring (BLOOD PRESSURE KIT) DEVI 1 Device by Does not apply route daily. (Patient not taking: No sig reported) 1 each 0   ferrous sulfate 325 (65 FE) MG tablet Take 1 tablet (325 mg total) by mouth daily with breakfast. (Patient not taking: No sig reported) 325 tablet 3   NIFEdipine (PROCARDIA-XL/NIFEDICAL-XL) 30 MG 24 hr tablet Take 1 tablet (30 mg total) by mouth daily. 30 tablet 1   [DISCONTINUED] labetalol (NORMODYNE) 200 MG tablet Take 4 tablets (800 mg total) by mouth 3 (three) times daily. (Patient not taking: Reported on  01/06/2019) 360 tablet 1    ROS: Pertinent positives and negative per HPI, all others reviewed and negative  Physical Exam   BP 137/74 (BP Location: Right Arm)   Pulse 60   Temp 98.4 F (36.9 C) (Oral)   Resp 16   Ht 5\' 7"  (1.702 m)   Wt 62.4 kg   LMP  (LMP Unknown)   SpO2 100%   BMI 21.54 kg/m   Patient Vitals for the past 24 hrs:  BP Temp Temp src Pulse Resp SpO2 Height Weight  07/27/22 1735 137/74 98.4 F (36.9 C) Oral 60 16 100 % 5\' 7"  (1.702 m) 62.4 kg    Physical Exam Vitals reviewed.  Constitutional:      Appearance: Normal appearance. She is normal weight.  HENT:     Head: Normocephalic and atraumatic.     Right Ear: External ear normal.     Left Ear: External ear normal.  Cardiovascular:     Rate and Rhythm: Normal rate and regular rhythm.  Pulmonary:     Effort: Pulmonary effort is normal.     Breath sounds: Normal breath sounds.  Abdominal:     General: Abdomen is flat.     Palpations: Abdomen is soft.  Skin:    General: Skin is warm.     Capillary Refill: Capillary refill takes less than 2 seconds.  Psychiatric:        Mood and Affect: Mood normal.     Cervical Exam   none  Bedside Ultrasound Done Pt informed that the ultrasound is considered a limited OB ultrasound and is not intended to be a complete ultrasound exam.  Patient also informed that the ultrasound is not being completed with the intent of assessing for fetal or placental anomalies or any pelvic abnormalities.  Explained that the purpose of today's ultrasound is to assess for  viability.  Patient acknowledges the purpose of the exam and the limitations of the study.     FHT 121bpm on [place]  Labs Results for orders placed or performed during the hospital encounter of 07/27/22 (from the past 24 hour(s))  Pregnancy, urine POC     Status: Abnormal   Collection Time: 07/27/22  5:38 PM  Result Value Ref Range   Preg Test, Ur POSITIVE (A) NEGATIVE  Urinalysis, Routine w reflex microscopic  -Urine, Clean Catch     Status: Abnormal   Collection Time: 07/27/22  5:40 PM  Result Value Ref Range  Color, Urine YELLOW YELLOW   APPearance CLEAR CLEAR   Specific Gravity, Urine 1.017 1.005 - 1.030   pH 6.0 5.0 - 8.0   Glucose, UA NEGATIVE NEGATIVE mg/dL   Hgb urine dipstick SMALL (A) NEGATIVE   Bilirubin Urine NEGATIVE NEGATIVE   Ketones, ur NEGATIVE NEGATIVE mg/dL   Protein, ur NEGATIVE NEGATIVE mg/dL   Nitrite NEGATIVE NEGATIVE   Leukocytes,Ua NEGATIVE NEGATIVE   RBC / HPF 0-5 0 - 5 RBC/hpf   WBC, UA 0-5 0 - 5 WBC/hpf   Bacteria, UA RARE (A) NONE SEEN   Squamous Epithelial / HPF 0-5 0 - 5 /HPF   Mucus PRESENT     Imaging No results found.  MAU Course  MDM: low  This patient presents to the ED for concern of   Chief Complaint  Patient presents with   Nausea   Fatigue     These complains involves an extensive number of treatment options, and is a complaint that carries with it a high risk of complications and morbidity.  The differential diagnosis for  1. N/V INCLUDES pregnancy related, infection  Co morbidities that complicate the patient evaluation:  Additional history obtained from n/a  Interpreter services used: not applicable  External records from outside source obtained and reviewed including Prenatal care records  Lab Tests: UA and Wet prep  I ordered, and personally interpreted labs.  The pertinent results include:  wet prep, UA  Imaging Studies ordered:  I ordered imaging studies includingTransvaginal [place] I independently visualized and interpreted imaging which showed viable IUP I agree with the radiologist interpretation  Cardiac Testing/Monitoring:  EKG was not ordered today. I personally reviewed or consulted with a physician to help with intrepretation.   Medicines ordered and prescription drug management:  Medications: tylenol   Reevaluation of the patient after these medicines showed that the patient improved I have reviewed the  patients home medicines and have made adjustments as needed  Test Considered: N/a  Critical Interventions: n/a  Consultations Obtained: n/a  MAU Course: - pt seen after positive pregnancy test. She is very overwhelmed/emotional. Considering all options for pregnancy termination. Resources given.  - promethazine helped. UA negative. Given PO trial - wet prep ordered  After the interventions noted above, I reevaluated the patient and found that they have :improved  Dispostion: discharged   Assessment and Plan  1. Nausea and vomiting during pregnancy - Discharge patient  2. [redacted] weeks gestation of pregnancy - Discharge patient   Patient given information on order to establish prenatal care.  Gave nausea medication, aspirin given her history of preeclampsia and HELLP syndrome, and prenatal vitamins. Also discussed termination resources and patient says she did not need them as she would know where to go and after interventions of her symptoms that she may consider not terminating this pregnancy.  Gave return and preterm labor precautions.  Follow-up with primary OB.   Allergies as of 07/27/2022       Reactions   Septra [bactrim] Rash        Medication List     STOP taking these medications    Blood Pressure Kit Devi   ferrous sulfate 325 (65 FE) MG tablet   NIFEdipine 30 MG 24 hr tablet Commonly known as: PROCARDIA-XL/NIFEDICAL-XL       TAKE these medications    aspirin EC 81 MG tablet Take 1 tablet (81 mg total) by mouth daily. Swallow whole.   Prenatal Vitamin Plus Low Iron 27-1 MG Tabs Take 1 tablet  by mouth daily.   promethazine 12.5 MG tablet Commonly known as: PHENERGAN Take 1 tablet (12.5 mg total) by mouth every 6 (six) hours as needed for nausea or vomiting.        Myrtie Hawk, DO FMOB Fellow, Faculty practice [facility], Center for [facility] 07/27/22  6:58 PM

## 2022-07-27 NOTE — MAU Note (Addendum)
.  Evelyn Mcclain is a 27 y.o. at Unknown here in MAU reporting: N/V and fatigue for the past week. She reports she is not vomiting continuously and reports 1-2 episodes per day. She reports at time her nausea is so bad she attempts to make herself vomit by sticking her finger down her throat. Denies pain. Denies vaginal bleeding and vaginal discharge. She reports she tried to eat pizza today but reports she has not appetite so she stopped. Requesting ginger ale and graham crackers. Requesting something for nausea.  She reports she has not taken a pregnancy test at home but reports she knows she is pregnant.  +UPT in MAU.  LMP: Sometime in May - unsure of when Onset of complaint: x1 week Pain score: Denies pain.  Lab orders placed from triage:  POCT Preg, UA

## 2022-07-27 NOTE — Progress Notes (Signed)
Dr Camelia Phenes in earlier to discuss test results and d/c plan. Written and verbal d/c instructions given and understanding voiced.

## 2022-07-28 ENCOUNTER — Other Ambulatory Visit: Payer: Self-pay | Admitting: Advanced Practice Midwife

## 2022-07-28 MED ORDER — PROMETHAZINE HCL 25 MG PO TABS: 25.0000 mg | ORAL_TABLET | Freq: Four times a day (QID) | ORAL | 2 refills | Status: DC | PRN

## 2022-07-28 NOTE — Progress Notes (Signed)
Called and wants Rx sent to [facility] pharmacy so she can go to work

## 2023-03-18 ENCOUNTER — Encounter (HOSPITAL_COMMUNITY): Payer: Self-pay

## 2023-03-18 ENCOUNTER — Inpatient Hospital Stay (HOSPITAL_COMMUNITY)
Admission: AD | Admit: 2023-03-18 | Discharge: 2023-03-18 | Discharge status: Against Medical Advice | Payer: Medicaid Other | Attending: Obstetrics & Gynecology | Admitting: Obstetrics & Gynecology

## 2023-03-18 DIAGNOSIS — Z3A00 Weeks of gestation of pregnancy not specified: Secondary | ICD-10-CM

## 2023-03-18 DIAGNOSIS — Z5321 Procedure and treatment not carried out due to patient leaving prior to being seen by health care provider: Secondary | ICD-10-CM | POA: Diagnosis present

## 2023-03-18 DIAGNOSIS — O4690 Antepartum hemorrhage, unspecified, unspecified trimester: Secondary | ICD-10-CM

## 2023-03-18 DIAGNOSIS — Z3201 Encounter for pregnancy test, result positive: Secondary | ICD-10-CM

## 2023-03-18 DIAGNOSIS — O3680X0 Pregnancy with inconclusive fetal viability, not applicable or unspecified: Secondary | ICD-10-CM | POA: Diagnosis not present

## 2023-03-18 LAB — URINALYSIS, ROUTINE W REFLEX MICROSCOPIC
Bilirubin Urine: NEGATIVE
Glucose, UA: NEGATIVE mg/dL
Ketones, ur: NEGATIVE mg/dL
Leukocytes,Ua: NEGATIVE
Nitrite: NEGATIVE
Protein, ur: NEGATIVE mg/dL
Specific Gravity, Urine: 1.024 (ref 1.005–1.030)
pH: 6 (ref 5.0–8.0)

## 2023-03-18 LAB — WET PREP, GENITAL
Clue Cells Wet Prep HPF POC: NONE SEEN
Sperm: NONE SEEN
Trich, Wet Prep: NONE SEEN
WBC, Wet Prep HPF POC: <10 (ref <10)
Yeast Wet Prep HPF POC: NONE SEEN

## 2023-03-18 LAB — POCT PREGNANCY, URINE: Preg Test, Ur: POSITIVE — AB

## 2023-03-18 NOTE — Discharge Instructions (Signed)
[place] for [employer] at [employer] [facility]    Phone: [place] for [employer] at [place]   Phone: [place] for [employer] at [place]  Phone: [place] for [employer] at [employer]  Phone: [place] for [employer] at [place]  Phone: [place] for [place] at [facility]   Phone: [facility]       Phone: [phone]  [facility]    Phone: [phone]   [facility]    Phone: [phone]  [facility]    Phone: [place]    Phone: [phone]  [place] [facility]       Phone: [phone]   [place] [facility]  Phone: [place]    Phone: [phone]  [facility]   Phone: [phone]  [facility]      Phone: [phone]  [facility]    Phone: [phone]

## 2023-03-18 NOTE — MAU Note (Signed)
 Patient came up to nurse's station reporting that she is going to leave and go to her appointment at [facility] because the wait is too long for her results. Patient signed AMA paperwork and verbalized understanding.

## 2023-03-18 NOTE — MAU Note (Signed)
.  Evelyn Mcclain is a 28 y.o. at unknown here in MAU reporting: she had her last period on 01/14/23 and missed her period in January. No care during this time. Took two positive HPT yesterday after she started having light, bright red vaginal bleeding. She is wearing a panti-liner for the bleeding. She started having lower abdominal and back cramping that started today - intermittent, 5/10. She has an appointment for termination at [facility] today at 1200 and wanted to figure out if she was having a miscarriage prior to her appointment. She also believes that she may have BV and would like testing for that - having brown and white discharge that started about a week ago.   LMP: 01/14/23 Onset of complaint: Yesterday Pain score: 5/10 Vitals:   03/18/23 1012  BP: 132/60  Pulse: 78  Resp: 18  Temp: 98.3 F (36.8 C)  SpO2: 100%     FHT: N/A  Lab orders placed from triage: UA preg

## 2023-03-18 NOTE — MAU Provider Note (Signed)
 Event Date/Time   First Provider Initiated Contact with Patient 03/18/23 1106      S Ms. Evelyn Mcclain is a 28 y.o. [phone] patient who presents to MAU today with complaint of LMP 01/14/23 and positive UPT's at home yesterday. She reports that she had some brownish bleeding last week and then today vaginal bleeding similar to menses with cramping. She is tearful and appears anxious stating that she has a consultation appointment today for an abortion at [facility]  but is concerned she may be miscarrying. I counseled the patient that we need lab work with an ultrasound to confirm location of the pregnancy and asked if she wanted to proceed with our work up and/or felt she needed to keep her appointment at [facility]. She stated she would like to proceed with our evaluation and understands that she will likely miss her appointment at [facility].   O BP 132/60 (BP Location: Right Arm)   Pulse 78   Temp 98.3 F (36.8 C) (Oral)   Resp 18   Ht 5\' 7"  (1.702 m)   Wt 69.7 kg   LMP 01/14/2023   SpO2 100%   Breastfeeding Unknown   BMI 24.06 kg/m  Physical Exam Vitals and nursing note reviewed.  Constitutional:      Appearance: She is well-developed. She is not ill-appearing.  HENT:     Head: Normocephalic.  Cardiovascular:     Rate and Rhythm: Normal rate.  Pulmonary:     Effort: Pulmonary effort is normal.     Breath sounds: Normal breath sounds.  Abdominal:     Palpations: Abdomen is soft.  Skin:    General: Skin is warm and dry.  Neurological:     Mental Status: She is alert and oriented to person, place, and time.  Psychiatric:        Attention and Perception: Attention normal.        Mood and Affect: Mood is anxious. Affect is tearful.        Speech: Speech normal.        Behavior: Behavior is agitated. Behavior is cooperative.        Judgment: Judgment normal.     ASSESSMENT Medical screening exam complete   Orders Placed This Encounter  Procedures   Wet  prep, genital    Standing Status:   Standing    Number of Occurrences:   1   US OB LESS THAN 14 WEEKS WITH OB TRANSVAGINAL    Standing Status:   Standing    Number of Occurrences:   1    Symptom/Reason for Exam:   Vaginal bleeding affecting early pregnancy [[id number]]   Urinalysis, Routine w reflex microscopic -Urine, Random    Standing Status:   Standing    Number of Occurrences:   1    Specimen Source:   Urine, Random [[id number]]   CBC    Standing Status:   Standing    Number of Occurrences:   1   hCG, quantitative, pregnancy    Standing Status:   Standing    Number of Occurrences:   1   Pregnancy, urine POC    Standing Status:   Standing    Number of Occurrences:   1   ABO/Rh    Standing Status:   Standing    Number of Occurrences:   1      PLAN Nurse just informed me that the patient left AMA and signed the AMA form prior to labs/ultrasound  Egidio Lofgren,  Wilmer Floor, NP 03/18/2023 11:14 AM

## 2023-03-21 LAB — GC/CHLAMYDIA PROBE AMP (~~LOC~~) NOT AT ARMC
Chlamydia: NEGATIVE
Comment: NEGATIVE
Comment: NORMAL
Neisseria Gonorrhea: NEGATIVE

## 2023-03-22 ENCOUNTER — Inpatient Hospital Stay (HOSPITAL_COMMUNITY): Payer: Medicaid Other

## 2023-03-22 ENCOUNTER — Inpatient Hospital Stay (HOSPITAL_COMMUNITY)
Admission: AD | Admit: 2023-03-22 | Discharge: 2023-03-22 | Disposition: A | Discharge status: Home / Self Care | Payer: Medicaid Other | Attending: Obstetrics and Gynecology | Admitting: Obstetrics and Gynecology

## 2023-03-22 ENCOUNTER — Encounter (HOSPITAL_COMMUNITY): Payer: Self-pay | Admitting: Obstetrics and Gynecology

## 2023-03-22 DIAGNOSIS — O200 Threatened abortion: Secondary | ICD-10-CM | POA: Diagnosis present

## 2023-03-22 DIAGNOSIS — O020 Blighted ovum and nonhydatidiform mole: Secondary | ICD-10-CM

## 2023-03-22 DIAGNOSIS — Z3A09 9 weeks gestation of pregnancy: Secondary | ICD-10-CM | POA: Insufficient documentation

## 2023-03-22 DIAGNOSIS — O209 Hemorrhage in early pregnancy, unspecified: Secondary | ICD-10-CM

## 2023-03-22 HISTORY — DX: Urinary tract infection, site not specified: N39.0

## 2023-03-22 LAB — CBC
HCT: 30.1 % — ABNORMAL LOW (ref 36.0–46.0)
Hemoglobin: 10.1 g/dL — ABNORMAL LOW (ref 12.0–15.0)
MCH: 32.2 pg (ref 26.0–34.0)
MCHC: 33.6 g/dL (ref 30.0–36.0)
MCV: 95.9 fL (ref 80.0–100.0)
Platelets: 202 10*3/uL (ref 150–400)
RBC: 3.14 MIL/uL — ABNORMAL LOW (ref 3.87–5.11)
RDW: 11.7 % (ref 11.5–15.5)
WBC: 5.8 10*3/uL (ref 4.0–10.5)
nRBC: 0 % (ref 0.0–0.2)

## 2023-03-22 LAB — HCG, QUANTITATIVE, PREGNANCY: hCG, Beta Chain, Quant, S: 5853 m[IU]/mL — ABNORMAL HIGH (ref <5)

## 2023-03-22 MED ORDER — FERROUS SULFATE 325 (65 FE) MG PO TABS: 325.0000 mg | ORAL_TABLET | Freq: Every day | ORAL | 0 refills | Status: DC

## 2023-03-22 NOTE — Discharge Instructions (Signed)
[place] for [employer] at [employer] [facility]    Phone: [place] for [employer] at [place]   Phone: [place] for [employer] at [place]  Phone: [place] for [employer] at [employer]  Phone: [place] for [employer] at [place]  Phone: [place] for [place] at [facility]   Phone: [facility]       Phone: [phone]  [facility]    Phone: [phone]   [facility]    Phone: [phone]  [facility]    Phone: [place]    Phone: [phone]  [place] [facility]       Phone: [phone]   [place] [facility]  Phone: [place]    Phone: [phone]  [facility]   Phone: [phone]  [facility]      Phone: [phone]  [facility]    Phone: [phone]

## 2023-03-22 NOTE — MAU Provider Note (Signed)
 S Evelyn Mcclain is a 28 y.o. [id number] patient who presents to MAU today with complaint of vaginal bleeding. She reports she was seen at [facility] choice for a consult and is scheduled for an abortion procedure tomorrow 03/23/23. This morning she states she passed a large clot approximately "orange sized" but no tissue to her knowledge and she is still c/o vaginal bleeding and is concerned regarding the amount of bleeding and want's to know if she is having a SAB.   O BP 129/72   Pulse 69   Temp 98.5 F (36.9 C) (Oral)   Resp 18   Ht 5\' 7"  (1.702 m)   Wt 69.8 kg   LMP 01/14/2023   SpO2 97%   BMI 24.09 kg/m    Today's Vitals   03/22/23 1252 03/22/23 1255 03/22/23 1406  BP:  (!) 140/68 129/72  Pulse:  95 69  Resp:  18   Temp:  98.5 F (36.9 C)   TempSrc:  Oral   SpO2:  97%   Weight:  69.8 kg   Height:  5\' 7"  (1.702 m)   PainSc: 8      Body mass index is 24.09 kg/m.  Physical Exam Vitals and nursing note reviewed. Exam conducted with a chaperone present.  Constitutional:      General: She is not in acute distress. HENT:     Head: Normocephalic.     Nose: Nose normal.     Mouth/Throat:     Mouth: Mucous membranes are moist.  Cardiovascular:     Rate and Rhythm: Normal rate.  Pulmonary:     Effort: Pulmonary effort is normal.     Breath sounds: Normal breath sounds.  Abdominal:     Palpations: Abdomen is soft.  Genitourinary:    General: Normal vulva.     Exam position: Lithotomy position.     Vagina: Bleeding present.     Cervix: Cervical bleeding present.     Comments: Unable to visualize the cervical OS due to the blood in vaginal vault . 1 large blood clot was manually evacuated from the vagina "Tennis ball Sized" Musculoskeletal:        General: Normal range of motion.     Cervical back: Normal range of motion.  Skin:    General: Skin is warm and dry.  Neurological:     Mental Status: She is alert and oriented to person, place, and time.   Psychiatric:        Attention and Perception: Attention and perception normal.        Mood and Affect: Mood is anxious.        Speech: Speech normal.        Behavior: Behavior normal.      Orders Placed This Encounter  Procedures   US OB LESS THAN 14 WEEKS WITH OB TRANSVAGINAL    Standing Status:   Standing    Number of Occurrences:   1    Symptom/Reason for Exam:   Vaginal bleeding affecting early pregnancy [[id number]]    Release to patient:   Immediate   CBC    Standing Status:   Standing    Number of Occurrences:   1   hCG, quantitative, pregnancy    Standing Status:   Standing    Number of Occurrences:   1   ABO/Rh    Standing Status:   Standing    Number of Occurrences:   1       Results  for orders placed or performed during the hospital encounter of 03/22/23 (from the past 24 hours)  ABO/Rh     Status: None   Collection Time: 03/22/23  2:25 PM  Result Value Ref Range   ABO/RH(D)      B POS Performed at [facility], [address], [place], [place] [address]   CBC     Status: Abnormal   Collection Time: 03/22/23  2:26 PM  Result Value Ref Range   WBC 5.8 4.0 - 10.5 K/uL   RBC 3.14 (L) 3.87 - 5.11 MIL/uL   Hemoglobin 10.1 (L) 12.0 - 15.0 g/dL   HCT 91.4 (L) 78.2 - 95.6 %   MCV 95.9 80.0 - 100.0 fL   MCH 32.2 26.0 - 34.0 pg   MCHC 33.6 30.0 - 36.0 g/dL   RDW 21.3 08.6 - 57.8 %   Platelets 202 150 - 400 K/uL   nRBC 0.0 0.0 - 0.2 %    MDM  HIGH  Labs ABO: B Positive CBC: unremarkable Hgb = 10.1 - Will send RX for Ferrous Sulfate Exam: as above  I have reviewed the patient chart and performed the physical exam . I have ordered & interpreted the lab results and reviewed and interpreted the images from the ultrasound performed c/w gestational sac IUP Medications ordered as stated below.  A/P as described below.  Counseling and education provided and patient agreeable  with plan as described below. Verbalized understanding.    ASSESSMENT Medical  screening exam complete Vaginal bleeding in Pregnancy Gestational Sac = 6w 6d   1. Empty gestational sac with ongoing pregnancy  2. Threatened abortion affecting intrauterine pregnancy  3. Vaginal bleeding affecting early pregnancy (Primary)  4. [redacted] weeks gestation of pregnancy    PLAN Discharge from MAU in stable condition Patient verbalized she will keep her scheduled appointment at [facility] 03/23/23 Please see AVS for complete verbal and written instructions given Warning signs for worsening condition that would warrant emergency follow-up discussed Patient may return to MAU as needed   Colman Cater, NP 03/22/2023 3:27 PM

## 2023-03-22 NOTE — MAU Note (Signed)
 Evelyn Mcclain is a 28 y.o. at [redacted]w[redacted]d here in MAU reporting: [redacted]w[redacted]d by [place] at a [facility] last 2/21.  Was having some bleeding  at that time.  Is scheduled for the procedure tomorrow.  Today, the  cramping and bleeding got worse. Passed a large clot.  Is afraid she may be miscarrying or not losing too much blood. Changing every 2 hrs. Is not dizzy.  Hx: "I took that one pill one time, lost so much blood, had to have a D&C", did not require a blood transfusion.    Onset of complaint: ongoing bleeding, worse today Pain score: 8 Vitals:   03/22/23 1255  BP: (!) 140/68  Pulse: 95  Resp: 18  Temp: 98.5 F (36.9 C)  SpO2: 97%      Lab orders placed from triage:

## 2023-03-23 LAB — ABO/RH: ABO/RH(D): B POS

## 2023-10-06 ENCOUNTER — Other Ambulatory Visit: Payer: Self-pay

## 2023-10-06 ENCOUNTER — Ambulatory Visit
Admission: EM | Admit: 2023-10-06 | Discharge: 2023-10-06 | Disposition: A | Discharge status: Home / Self Care | Attending: Emergency Medicine | Admitting: Emergency Medicine

## 2023-10-06 DIAGNOSIS — Z113 Encounter for screening for infections with a predominantly sexual mode of transmission: Secondary | ICD-10-CM | POA: Insufficient documentation

## 2023-10-06 LAB — POCT URINE PREGNANCY: Preg Test, Ur: NEGATIVE

## 2023-10-06 NOTE — ED Triage Notes (Signed)
 Pt c/o white vaginal discharge, vaginal irritationx1wk

## 2023-10-06 NOTE — Discharge Instructions (Signed)
 We will call you if anything on your swab returns positive. You can also see these results on MyChart. Please abstain from sexual intercourse until your results return.

## 2023-10-06 NOTE — ED Provider Notes (Signed)
 [facility]    CSN: [id number] Arrival date & time: 10/06/23  0844      History   Chief Complaint Chief Complaint  Patient presents with   Vaginal Itching    HPI Evelyn Mcclain is a 28 y.o. female.  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   UTI (urinary tract infection)     Patient Active Problem List   Diagnosis Date Noted   SVD (spontaneous vaginal delivery) 02/01/2020   Preeclampsia, severe, third trimester 02/01/2020   History of preterm delivery 02/01/2020   Substance abuse affecting pregnancy, antepartum (HCC) 02/01/2020   Anemia affecting pregnancy in third trimester 01/30/2020   Supervision of high risk pregnancy, antepartum 10/09/2019   History of HELLP syndrome, currently pregnant 08/15/2017   History of gestational hypertension 08/08/2017   UTI (urinary tract infection) during pregnancy, second trimester 05/12/2017   Marijuana use 03/15/2017   Polysubstance abuse (HCC) 08/30/2011    Past Surgical History:  Procedure Laterality Date   DILATION AND CURETTAGE OF UTERUS N/A 04/07/2019   Procedure: SUCTION DILATATION AND CURETTAGE;  Surgeon: Pezzuto, Laura, MD;  Location: [facility] OR;  Service: Gynecology;  Laterality: N/A;   INDUCED ABORTION      OB History     Gravida  6   Para  2   Term  0   Preterm  2   AB  3   Living  2      SAB  0   IAB  3   Ectopic  0   Multiple  0   Live Births  2            Home Medications    Prior to Admission medications   Medication Sig Start Date End Date Taking? Authorizing Provider  acetaminophen  (TYLENOL ) 500 MG tablet  Take 500 mg by mouth every 6 (six) hours as needed.    [provider]  labetalol  (NORMODYNE ) 200 MG tablet Take 4 tablets (800 mg total) by mouth 3 (three) times daily. Patient not taking: Reported on 01/06/2019 08/17/17 01/06/19  Izell Harari, MD    Family History Family History  Problem Relation Age of Onset   Hypertension Mother    Other Father        unknown   Asthma Brother    Hypertension Maternal Aunt    Hypertension Maternal Uncle    Hypertension Maternal Grandmother    Asthma Maternal Grandmother    Hypertension Maternal Grandfather    Diabetes Paternal Grandmother    Diabetes Paternal Grandfather     Social History Social History   Tobacco Use   Smoking status: Some Days    Current packs/day: 0.25    Types: Cigarettes   Smokeless tobacco: Never  Vaping Use   Vaping status: Former  Substance Use Topics   Alcohol use: Not Currently    Comment: occ   Drug use: Not Currently    Types: Marijuana    Comment: June 2019     Allergies   Septra [bactrim]   Review of Systems Review of Systems As per  HPI  Physical Exam Triage Vital Signs ED Triage Vitals  Encounter Vitals Group     BP 10/06/23 1047 (!) 165/99     Girls Systolic BP Percentile --      Girls Diastolic BP Percentile --      Boys Systolic BP Percentile --      Boys Diastolic BP Percentile --      Pulse Rate 10/06/23 1047 75     Resp 10/06/23 1047 16     Temp 10/06/23 1047 98 F (36.7 C)     Temp Source 10/06/23 1047 Oral     SpO2 10/06/23 1047 98 %     Weight --      Height --      Head Circumference --      Peak Flow --      Pain Score 10/06/23 1044 0     Pain Loc --      Pain Education --      Exclude from Growth Chart --    No data found.  Updated Vital Signs BP (!) 165/99   Pulse 75   Temp 98 F (36.7 C) (Oral)   Resp 16   LMP 09/12/2023   SpO2 98%   Breastfeeding No   Visual Acuity Right Eye Distance:   Left Eye Distance:   Bilateral Distance:     Right Eye Near:   Left Eye Near:    Bilateral Near:     Physical Exam Vitals and nursing note reviewed.  Constitutional:      General: She is not in acute distress.    Appearance: Normal appearance.  HENT:     Mouth/Throat:     Pharynx: Oropharynx is clear.  Cardiovascular:     Rate and Rhythm: Normal rate and regular rhythm.     Pulses: Normal pulses.     Heart sounds: Normal heart sounds.  Pulmonary:     Effort: Pulmonary effort is normal.     Breath sounds: Normal breath sounds.  Abdominal:     Palpations: Abdomen is soft.     Tenderness: There is no abdominal tenderness. There is no guarding.  Neurological:     Mental Status: She is alert and oriented to person, place, and time.      UC Treatments / Results  Labs (all labs ordered are listed, but only abnormal results are displayed) Labs Reviewed  POCT URINE PREGNANCY  CERVICOVAGINAL ANCILLARY ONLY    EKG   Radiology No results found.  Procedures Procedures (including critical care time)  Medications Ordered in UC Medications - No data to display  Initial Impression / Assessment and Plan / UC Course  I have reviewed the triage vital signs and the nursing notes.  Pertinent labs & imaging results that were available during my care of the patient were reviewed by me and considered in my medical decision making (see chart for details).  UPT negative  Cytology swab pending Treat positive result as indicated No questions at this time, agrees to plan   Final Clinical Impressions(s) / UC Diagnoses   Final diagnoses:  Screen for STD (sexually transmitted disease)     Discharge Instructions      We will call you if anything on your swab returns positive. You can also see these results on MyChart. Please abstain from sexual intercourse until your results return.    ED Prescriptions   None    PDMP not reviewed this encounter.   Jeryl Stabs, PA-C 10/06/23 1141

## 2023-10-07 ENCOUNTER — Ambulatory Visit (HOSPITAL_COMMUNITY): Payer: Self-pay

## 2023-10-07 LAB — CERVICOVAGINAL ANCILLARY ONLY
Bacterial Vaginitis (gardnerella): POSITIVE — AB
Candida Glabrata: NEGATIVE
Candida Vaginitis: NEGATIVE
Chlamydia: NEGATIVE
Comment: NEGATIVE
Comment: NEGATIVE
Comment: NEGATIVE
Comment: NEGATIVE
Comment: NEGATIVE
Comment: NORMAL
Neisseria Gonorrhea: NEGATIVE
Trichomonas: POSITIVE — AB

## 2023-10-07 MED ORDER — METRONIDAZOLE 500 MG PO TABS
500.0000 mg | ORAL_TABLET | Freq: Two times a day (BID) | ORAL | 0 refills | Status: AC
Start: 2023-10-07 — End: 2023-10-14

## 2023-11-25 ENCOUNTER — Ambulatory Visit: Payer: Self-pay | Admitting: Family Medicine

## 2023-11-25 DIAGNOSIS — Z0000 Encounter for general adult medical examination without abnormal findings: Secondary | ICD-10-CM

## 2023-11-25 NOTE — Progress Notes (Incomplete)
 New Patient Office Visit   Subjective     Patient ID: Evelyn Mcclain, female   DOB: December 04, 1995  Age: 28 y.o. MRN: [id number]   CC:  No chief complaint on file.     HPI Evelyn Mcclain presents to establish care       History of Pregnancy Loss; Pre-eclampsia: - Has been pregnant 6 times, with her first pregnancy in 2019 having developed pre-eclampsia in the 3rd trimester. Out of those 6 pregnancies, she has lost half and has made it to term twice.    History of Elevated Blood Pressure:: - Occasionally elevated blood pressures coincide with her pregnancies, due to her history of preeclampsia. - Previous medications: IV Hydralazine , IV Labetalol  and Labetalol  PO, IV Lasix , Enalapril  2.5 mg, and Nifedipine .  - Most recent elevated blood pressure 10/06/2023 was 165/99 - Checking BP at home: *** - Denies any SOB, recurrent headaches, CP, vision changes, LE edema, dizziness, palpitations, or medication side effects. - Diet: *** - Exercise: *** - Stressors:  History of STI Exposure: - Most recently positive for Trichomonas on 10/06/2023 and BV - Previous infections: chlamydia   Outpatient Medications Prior to Visit  Medication Sig   acetaminophen  (TYLENOL ) 500 MG tablet Take 500 mg by mouth every 6 (six) hours as needed.   No facility-administered medications prior to visit.   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   UTI (urinary tract infection)     Past Surgical History:  Procedure Laterality Date   DILATION AND CURETTAGE OF UTERUS N/A 04/07/2019   Procedure: SUCTION DILATATION AND CURETTAGE;  Surgeon: Pezzuto, Laura, MD;  Location: [facility] OR;  Service: Gynecology;  Laterality: N/A;   INDUCED ABORTION       Family History  Problem  Relation Age of Onset   Hypertension Mother    Other Father        unknown   Asthma Brother    Hypertension Maternal Aunt    Hypertension Maternal Uncle    Hypertension Maternal Grandmother    Asthma Maternal Grandmother    Hypertension Maternal Grandfather    Diabetes Paternal Grandmother    Diabetes Paternal Grandfather     Social History   Socioeconomic History   Marital status: Single    Spouse name: Not on file   Number of children: Not on file   Years of education: Not on file   Highest education level: Not on file  Occupational History   Not on file  Tobacco Use   Smoking status: Some Days    Current packs/day: 0.25    Types: Cigarettes   Smokeless tobacco: Never  Vaping Use   Vaping status: Former  Substance and Sexual Activity   Alcohol use: Not Currently    Comment: occ   Drug use: Not Currently    Types: Marijuana    Comment: June 2019   Sexual activity: Yes  Other Topics Concern   Not on file  Social History Narrative   Father of the baby was murdered in Fall 2021   Social Drivers of Corporate Investment Banker Strain: Not on file  Food Insecurity: Not on file  Transportation Needs: Not on file  Physical Activity: Not on file  Stress: Not on file  Social Connections: Not on file       ROS All review of systems negative except what is listed in the HPI    Objective     LMP 01/14/2023   Physical Exam     Assessment & Plan:     Problem List Items Addressed This Visit   None Visit Diagnoses       Encounter for medical examination to establish care    -  Primary                No follow-ups on file.  Waddell KATHEE Mon, NP  I,Emily Lagle,acting as a scribe for Waddell KATHEE Mon, NP.,have documented all relevant documentation on the behalf of Waddell KATHEE Mon, NP,as directed by  Waddell KATHEE Mon, NP while in the presence of Waddell KATHEE Mon, NP.  I, Waddell KATHEE Mon, NP, have reviewed all documentation for this visit. The documentation on  11/25/2023 for the exam, diagnosis, procedures, and orders are all accurate and complete.
